# Patient Record
Sex: Female | Born: 1990 | Race: White | Hispanic: No | Marital: Married | Smoking: Never smoker
Health system: Southern US, Community
[De-identification: ages and names within clinical notes are randomized; demographics above are authoritative.]

## PROBLEM LIST (undated history)

## (undated) DIAGNOSIS — Z789 Other specified health status: Secondary | ICD-10-CM

## (undated) DIAGNOSIS — M5126 Other intervertebral disc displacement, lumbar region: Secondary | ICD-10-CM

## (undated) HISTORY — DX: Other specified health status: Z78.9

## (undated) HISTORY — PX: NO PAST SURGERIES: SHX2092

---

## 2016-06-22 LAB — OB RESULTS CONSOLE RUBELLA ANTIBODY, IGM: RUBELLA: IMMUNE

## 2016-06-22 LAB — OB RESULTS CONSOLE ANTIBODY SCREEN: Antibody Screen: NEGATIVE

## 2016-06-22 LAB — OB RESULTS CONSOLE ABO/RH: RH Type: POSITIVE

## 2016-06-22 LAB — OB RESULTS CONSOLE HIV ANTIBODY (ROUTINE TESTING): HIV: NONREACTIVE

## 2016-06-22 LAB — OB RESULTS CONSOLE HEPATITIS B SURFACE ANTIGEN: Hepatitis B Surface Ag: NEGATIVE

## 2016-06-22 LAB — OB RESULTS CONSOLE GC/CHLAMYDIA
CHLAMYDIA, DNA PROBE: NEGATIVE
GC PROBE AMP, GENITAL: NEGATIVE

## 2016-06-22 LAB — OB RESULTS CONSOLE RPR: RPR: NONREACTIVE

## 2016-11-10 ENCOUNTER — Encounter (HOSPITAL_COMMUNITY): Payer: Self-pay

## 2016-11-10 ENCOUNTER — Emergency Department (HOSPITAL_COMMUNITY)
Admission: EM | Admit: 2016-11-10 | Discharge: 2016-11-10 | Disposition: A | Discharge status: Home / Self Care | Payer: 59 | Attending: Emergency Medicine | Admitting: Emergency Medicine

## 2016-11-10 DIAGNOSIS — L259 Unspecified contact dermatitis, unspecified cause: Secondary | ICD-10-CM

## 2016-11-10 DIAGNOSIS — O26893 Other specified pregnancy related conditions, third trimester: Secondary | ICD-10-CM | POA: Diagnosis present

## 2016-11-10 DIAGNOSIS — L258 Unspecified contact dermatitis due to other agents: Secondary | ICD-10-CM | POA: Diagnosis not present

## 2016-11-10 DIAGNOSIS — Z3A29 29 weeks gestation of pregnancy: Secondary | ICD-10-CM | POA: Insufficient documentation

## 2016-11-10 DIAGNOSIS — R21 Rash and other nonspecific skin eruption: Secondary | ICD-10-CM | POA: Diagnosis not present

## 2016-11-10 MED ORDER — FAMOTIDINE 20 MG PO TABS: 20.0000 mg | ORAL_TABLET | Freq: Two times a day (BID) | ORAL | 0 refills | Status: DC

## 2016-11-10 MED ORDER — FAMOTIDINE 20 MG PO TABS
20.0000 mg | ORAL_TABLET | Freq: Once | ORAL | Status: AC
  Administered 2016-11-10: 20 mg via ORAL
  Filled 2016-11-10: qty 1

## 2016-11-10 NOTE — ED Provider Notes (Signed)
[facility]-EMERGENCY DEPT Provider Note   CSN: [id number] Arrival date & time: 11/10/16  0257  Time seen 05:27 AM   History   Chief Complaint Chief Complaint  Patient presents with  . Rash    HPI Glenda Robles is a 26 y.o. female.  HPI patient states October 17 she started having a itchy rash that started in her groin area and has spread into her back.  She was seen on October 19 by her OB/GYN and was started on Claritin and Benadryl as needed for itching, and hydrocortisone cream.  She also was given a steroid Dosepak and was told to start it today.  She states her itching has improved with the Claritin and Benadryl however now the rash is painful.  She denies anything new in her environment including soaps, detergents, foods, clothing, and she denies doing any type of yard work.  They do not have any pets.  She has never had this before.  Nobody else has rash at the house.  Patient is G1 P0 Ab0, [redacted] weeks pregnant without complications.  OB Dr Mindi SlickerBanga  History reviewed. No pertinent past medical history.  There are no active problems to display for this patient.   History reviewed. No pertinent surgical history.  OB History    Gravida Para Term Preterm AB Living   1             SAB TAB Ectopic Multiple Live Births                   Home Medications    Prior to Admission medications   Medication Sig Start Date End Date Taking? Authorizing Provider  famotidine (PEPCID) 20 MG tablet Take 1 tablet (20 mg total) by mouth 2 (two) times daily. 11/10/16   Devoria AlbeKnapp, Gottlieb Zuercher, MD    Family History History reviewed. No pertinent family history.  Social History Social History  Substance Use Topics  . Smoking status: Never Smoker  . Smokeless tobacco: Never Used  . Alcohol use Not on file  employed   Allergies   Patient has no known allergies.   Review of Systems Review of Systems  All other systems reviewed and are negative.    Physical  Exam Updated Vital Signs BP (!) 129/93 (BP Location: Left Arm)   Pulse (!) 107   Temp 98.2 F (36.8 C) (Oral)   Resp 15   Ht 5\' 3"  (1.6 m)   Wt 70.3 kg (155 lb)   SpO2 100%   BMI 27.46 kg/m   Physical Exam  Constitutional: She is oriented to person, place, and time. She appears well-developed and well-nourished.  HENT:  Head: Normocephalic and atraumatic.  Right Ear: External ear normal.  Left Ear: External ear normal.  Nose: Nose normal.  Mouth/Throat: Oropharynx is clear and moist.  Eyes: Conjunctivae and EOM are normal.  Neck: Normal range of motion. Neck supple.  Cardiovascular: Normal rate.   Pulmonary/Chest: Effort normal. No respiratory distress.  Musculoskeletal: Normal range of motion. She exhibits no deformity.  Neurological: She is alert and oriented to person, place, and time. No cranial nerve deficit.  Skin: Skin is warm and dry. Rash noted.  Patient has intensely reddened rash in her inguinal folds bilaterally, left worse than the right with small vesicular lesions along the edges consistent with a contact dermatitis.  She also has some spread up towards her breast and in her back.  But the most intense is in her inguinal areas.  Patient  is noted to have shaved recently but she states she shaved after the rash started.  Psychiatric: She has a normal mood and affect. Her behavior is normal. Thought content normal.  Nursing note and vitals reviewed.      ED Treatments / Results  Labs (all labs ordered are listed, but only abnormal results are displayed) Labs Reviewed - No data to display  EKG  EKG Interpretation None       Radiology No results found.  Procedures Procedures (including critical care time)  Medications Ordered in ED Medications  famotidine (PEPCID) tablet 20 mg (not administered)     Initial Impression / Assessment and Plan / ED Course  I have reviewed the triage vital signs and the nursing notes.  Pertinent labs & imaging  results that were available during my care of the patient were reviewed by me and considered in my medical decision making (see chart for details).     We reviewed her methylprednisolone dose pack, patient is going to take her first dose now in the ED, and Pepcid was added.  It appears the Claritin and Benadryl have helped with the itching because that is not a complaint now.  We discussed to come back if she gets lesions in her mouth has difficulty breathing or gets a high fever.  Final Clinical Impressions(s) / ED Diagnoses   Final diagnoses:  Contact dermatitis, unspecified contact dermatitis type, unspecified trigger    New Prescriptions New Prescriptions   FAMOTIDINE (PEPCID) 20 MG TABLET    Take 1 tablet (20 mg total) by mouth 2 (two) times daily.    Plan discharge  Devoria Albe, MD, Concha Pyo, MD 11/10/16 [phone]

## 2016-11-10 NOTE — ED Notes (Signed)
Bed: WA04 Expected date:  Expected time:  Means of arrival:  Comments: 

## 2016-11-10 NOTE — ED Notes (Signed)
Pt went to OB-GYN on Wednesday for a rash on the LLQ abdomen. She was given cortisone cream, claritin, and prednisone. On Thursday night/Friday morning the rash spread around the abdomen/back/chest and became painful.

## 2016-11-10 NOTE — Discharge Instructions (Signed)
Use ice packs for comfort. You can try baking soda baths for comfort. Start your methylprednisolone pills this morning. Continue the zyrtec and benadryl as needed for itching.   Recheck if you get a fever, lesions in your mouth, you have trouble breathing or feel worse.

## 2016-11-10 NOTE — ED Triage Notes (Signed)
Pt reports abdominal rash that started Wednesday.

## 2016-11-10 NOTE — ED Notes (Signed)
Bed: WA05 Expected date:  Expected time:  Means of arrival:  Comments: 

## 2016-12-26 LAB — OB RESULTS CONSOLE GBS: GBS: NEGATIVE

## 2017-01-18 ENCOUNTER — Encounter (HOSPITAL_COMMUNITY): Payer: Self-pay | Admitting: *Deleted

## 2017-01-18 ENCOUNTER — Telehealth (HOSPITAL_COMMUNITY): Payer: Self-pay | Admitting: *Deleted

## 2017-01-18 ENCOUNTER — Ambulatory Visit (INDEPENDENT_AMBULATORY_CARE_PROVIDER_SITE_OTHER): Payer: Self-pay | Admitting: Pediatrics

## 2017-01-18 DIAGNOSIS — Z7681 Expectant parent(s) prebirth pediatrician visit: Secondary | ICD-10-CM

## 2017-01-18 NOTE — Telephone Encounter (Signed)
Preadmission screen  

## 2017-01-22 NOTE — H&P (Signed)
Glenda Robles is a 27 y.o. femaleG1P0 at 40 0/7 weeks (EDD 01/23/17 by LMP c/w 8 week US)   presenting for IOL at term.  Preanatal care uneventful   OB History    Gravida Para Term Preterm AB Living   1             SAB TAB Ectopic Multiple Live Births                 Past Medical History:  Diagnosis Date  . Medical history non-contributory    Past Surgical History:  Procedure Laterality Date  . NO PAST SURGERIES     Family History: family history includes Diabetes in her mother; Hypertension in her mother. Social History:  reports that  has never smoked. she has never used smokeless tobacco. She reports that she does not drink alcohol or use drugs.     Maternal Diabetes: No Genetic Screening: Declined Maternal Ultrasounds/Referrals: Normal Fetal Ultrasounds or other Referrals:  None Maternal Substance Abuse:  No Significant Maternal Medications:  None Significant Maternal Lab Results:  None Other Comments:  None  Review of Systems  Gastrointestinal: Negative for abdominal pain and heartburn.   Maternal Medical History:  Contractions: Frequency: irregular.   Perceived severity is mild.    Fetal activity: Perceived fetal activity is normal.    Prenatal Complications - Diabetes: none.      Last menstrual period 04/18/2016. Maternal Exam:  Uterine Assessment: Contraction strength is mild.  Contraction frequency is irregular.   Abdomen: Patient reports no abdominal tenderness. Fetal presentation: vertex  Introitus: Normal vulva. Normal vagina.  Pelvis: adequate for delivery.      Physical Exam  Constitutional: She appears well-developed and well-nourished.  Cardiovascular: Normal rate and regular rhythm.  Respiratory: Effort normal.  GI: Soft.  Genitourinary: Vagina normal.  Neurological: She is alert.  Psychiatric: She has a normal mood and affect.    Prenatal labs: ABO, Rh: O/Positive/-- (06/01 0000) Antibody: Negative (06/01 0000) Rubella: Immune (06/01  0000) RPR: Nonreactive (06/01 0000)  HBsAg: Negative (06/01 0000)  HIV: Non-reactive (06/01 0000)  GBS: Negative (12/05 0000)  One hour GCT 97 Hemoglobin AA  Assessment/Plan: Pt for IOL at term with favorable cervix.    Plan AROM and pitocin.   Oliver PilaKathy W Gregg Winchell 01/22/2017, 8:50 PM

## 2017-01-22 NOTE — L&D Delivery Note (Signed)
Delivery Note Patient reached complete dilation and pushed great.  At 5:38 PM a viable female was delivered via Vaginal, Spontaneous (Presentation: LOA  ).  APGAR: 8, 9; weight pending  .   Placenta status: delivered spontaneously.  One fragment removed on fundal exploration and no other placenta felt.    Cord:  with the following complications: nuchal x 1--reduced.   Anesthesia:  epidural Episiotomy: None Lacerations: 1st degree;Perineal Suture Repair: 3.0 vicryl rapide Est. Blood Loss (mL): 225mL  Mom to postpartum.  Baby to Couplet care / Skin to Skin. D/w pt circumcision and they desire in the hospital.  Oliver PilaKathy W Raisha Brabender 01/23/2017, 6:02 PM

## 2017-01-23 ENCOUNTER — Inpatient Hospital Stay (HOSPITAL_COMMUNITY): Payer: Managed Care, Other (non HMO) | Admitting: Anesthesiology

## 2017-01-23 ENCOUNTER — Inpatient Hospital Stay (HOSPITAL_COMMUNITY)
Admission: RE | Admit: 2017-01-23 | Discharge: 2017-01-25 | DRG: 807 | Disposition: A | Discharge status: Home / Self Care | Payer: Managed Care, Other (non HMO) | Source: Ambulatory Visit | Attending: Obstetrics and Gynecology | Admitting: Obstetrics and Gynecology

## 2017-01-23 ENCOUNTER — Inpatient Hospital Stay (HOSPITAL_COMMUNITY)
Admission: AD | Admit: 2017-01-23 | Payer: Managed Care, Other (non HMO) | Source: Ambulatory Visit | Admitting: Obstetrics and Gynecology

## 2017-01-23 ENCOUNTER — Encounter (HOSPITAL_COMMUNITY): Payer: Self-pay

## 2017-01-23 DIAGNOSIS — Z3A40 40 weeks gestation of pregnancy: Secondary | ICD-10-CM

## 2017-01-23 DIAGNOSIS — Z3483 Encounter for supervision of other normal pregnancy, third trimester: Secondary | ICD-10-CM | POA: Diagnosis present

## 2017-01-23 LAB — CBC
HCT: 38 % (ref 36.0–46.0)
Hemoglobin: 12.5 g/dL (ref 12.0–15.0)
MCH: 30.4 pg (ref 26.0–34.0)
MCHC: 32.9 g/dL (ref 30.0–36.0)
MCV: 92.5 fL (ref 78.0–100.0)
PLATELETS: 174 10*3/uL (ref 150–400)
RBC: 4.11 MIL/uL (ref 3.87–5.11)
RDW: 14.7 % (ref 11.5–15.5)
WBC: 9.9 10*3/uL (ref 4.0–10.5)

## 2017-01-23 LAB — ABO/RH: ABO/RH(D): O POS

## 2017-01-23 LAB — TYPE AND SCREEN
ABO/RH(D): O POS
Antibody Screen: NEGATIVE

## 2017-01-23 LAB — RPR: RPR Ser Ql: NONREACTIVE

## 2017-01-23 MED ORDER — OXYTOCIN 40 UNITS IN LACTATED RINGERS INFUSION - SIMPLE MED: 2.5000 [IU]/h | INTRAVENOUS | Status: DC

## 2017-01-23 MED ORDER — BENZOCAINE-MENTHOL 20-0.5 % EX AERO
1.0000 "application " | INHALATION_SPRAY | CUTANEOUS | Status: DC | PRN
  Administered 2017-01-24: 1 via TOPICAL
  Filled 2017-01-23: qty 56

## 2017-01-23 MED ORDER — OXYCODONE-ACETAMINOPHEN 5-325 MG PO TABS: 2.0000 | ORAL_TABLET | ORAL | Status: DC | PRN

## 2017-01-23 MED ORDER — ONDANSETRON HCL 4 MG/2ML IJ SOLN: 4.0000 mg | INTRAMUSCULAR | Status: DC | PRN

## 2017-01-23 MED ORDER — PHENYLEPHRINE 40 MCG/ML (10ML) SYRINGE FOR IV PUSH (FOR BLOOD PRESSURE SUPPORT)
80.0000 ug | PREFILLED_SYRINGE | INTRAVENOUS | Status: DC | PRN
  Filled 2017-01-23: qty 5

## 2017-01-23 MED ORDER — ONDANSETRON HCL 4 MG/2ML IJ SOLN: 4.0000 mg | Freq: Four times a day (QID) | INTRAMUSCULAR | Status: DC | PRN

## 2017-01-23 MED ORDER — OXYTOCIN BOLUS FROM INFUSION
500.0000 mL | Freq: Once | INTRAVENOUS | Status: AC
  Administered 2017-01-23: 500 mL via INTRAVENOUS

## 2017-01-23 MED ORDER — IBUPROFEN 600 MG PO TABS
600.0000 mg | ORAL_TABLET | Freq: Four times a day (QID) | ORAL | Status: DC
  Administered 2017-01-23 – 2017-01-25 (×6): 600 mg via ORAL
  Filled 2017-01-23 (×6): qty 1

## 2017-01-23 MED ORDER — DIBUCAINE 1 % RE OINT: 1.0000 "application " | TOPICAL_OINTMENT | RECTAL | Status: DC | PRN

## 2017-01-23 MED ORDER — ONDANSETRON HCL 4 MG PO TABS: 4.0000 mg | ORAL_TABLET | ORAL | Status: DC | PRN

## 2017-01-23 MED ORDER — SIMETHICONE 80 MG PO CHEW: 80.0000 mg | CHEWABLE_TABLET | ORAL | Status: DC | PRN

## 2017-01-23 MED ORDER — BUTORPHANOL TARTRATE 1 MG/ML IJ SOLN: 1.0000 mg | INTRAMUSCULAR | Status: DC | PRN

## 2017-01-23 MED ORDER — DIPHENHYDRAMINE HCL 50 MG/ML IJ SOLN: 12.5000 mg | INTRAMUSCULAR | Status: DC | PRN

## 2017-01-23 MED ORDER — COCONUT OIL OIL: 1.0000 "application " | TOPICAL_OIL | Status: DC | PRN

## 2017-01-23 MED ORDER — ZOLPIDEM TARTRATE 5 MG PO TABS: 5.0000 mg | ORAL_TABLET | Freq: Every evening | ORAL | Status: DC | PRN

## 2017-01-23 MED ORDER — LACTATED RINGERS IV SOLN
INTRAVENOUS | Status: DC
  Administered 2017-01-23: 1000 mL via INTRAVENOUS

## 2017-01-23 MED ORDER — EPHEDRINE 5 MG/ML INJ
10.0000 mg | INTRAVENOUS | Status: DC | PRN
  Filled 2017-01-23: qty 2

## 2017-01-23 MED ORDER — TETANUS-DIPHTH-ACELL PERTUSSIS 5-2.5-18.5 LF-MCG/0.5 IM SUSP: 0.5000 mL | Freq: Once | INTRAMUSCULAR | Status: DC

## 2017-01-23 MED ORDER — FENTANYL 2.5 MCG/ML BUPIVACAINE 1/10 % EPIDURAL INFUSION (WH - ANES)
14.0000 mL/h | INTRAMUSCULAR | Status: DC | PRN
  Administered 2017-01-23: 14 mL/h via EPIDURAL
  Filled 2017-01-23: qty 100

## 2017-01-23 MED ORDER — PHENYLEPHRINE 40 MCG/ML (10ML) SYRINGE FOR IV PUSH (FOR BLOOD PRESSURE SUPPORT)
80.0000 ug | PREFILLED_SYRINGE | INTRAVENOUS | Status: DC | PRN
  Filled 2017-01-23: qty 5
  Filled 2017-01-23: qty 10

## 2017-01-23 MED ORDER — PRENATAL MULTIVITAMIN CH
1.0000 | ORAL_TABLET | Freq: Every day | ORAL | Status: DC
  Administered 2017-01-24: 1 via ORAL
  Filled 2017-01-23: qty 1

## 2017-01-23 MED ORDER — LACTATED RINGERS IV SOLN: 500.0000 mL | INTRAVENOUS | Status: DC | PRN

## 2017-01-23 MED ORDER — ACETAMINOPHEN 325 MG PO TABS: 650.0000 mg | ORAL_TABLET | ORAL | Status: DC | PRN

## 2017-01-23 MED ORDER — OXYCODONE-ACETAMINOPHEN 5-325 MG PO TABS: 1.0000 | ORAL_TABLET | ORAL | Status: DC | PRN

## 2017-01-23 MED ORDER — WITCH HAZEL-GLYCERIN EX PADS: 1.0000 "application " | MEDICATED_PAD | CUTANEOUS | Status: DC | PRN

## 2017-01-23 MED ORDER — OXYTOCIN 40 UNITS IN LACTATED RINGERS INFUSION - SIMPLE MED
1.0000 m[IU]/min | INTRAVENOUS | Status: DC
  Administered 2017-01-23: 2 m[IU]/min via INTRAVENOUS
  Filled 2017-01-23: qty 1000

## 2017-01-23 MED ORDER — LACTATED RINGERS IV SOLN
500.0000 mL | Freq: Once | INTRAVENOUS | Status: AC
  Administered 2017-01-23: 500 mL via INTRAVENOUS

## 2017-01-23 MED ORDER — LIDOCAINE HCL (PF) 1 % IJ SOLN
30.0000 mL | INTRAMUSCULAR | Status: DC | PRN
  Filled 2017-01-23: qty 30

## 2017-01-23 MED ORDER — TERBUTALINE SULFATE 1 MG/ML IJ SOLN
0.2500 mg | Freq: Once | INTRAMUSCULAR | Status: DC | PRN
  Filled 2017-01-23: qty 1

## 2017-01-23 MED ORDER — LIDOCAINE HCL (PF) 1 % IJ SOLN
INTRAMUSCULAR | Status: DC | PRN
  Administered 2017-01-23: 13 mL via EPIDURAL

## 2017-01-23 MED ORDER — DIPHENHYDRAMINE HCL 25 MG PO CAPS: 25.0000 mg | ORAL_CAPSULE | Freq: Four times a day (QID) | ORAL | Status: DC | PRN

## 2017-01-23 MED ORDER — SENNOSIDES-DOCUSATE SODIUM 8.6-50 MG PO TABS
2.0000 | ORAL_TABLET | ORAL | Status: DC
  Administered 2017-01-24 (×2): 2 via ORAL
  Filled 2017-01-23 (×2): qty 2

## 2017-01-23 MED ORDER — SOD CITRATE-CITRIC ACID 500-334 MG/5ML PO SOLN: 30.0000 mL | ORAL | Status: DC | PRN

## 2017-01-23 NOTE — Anesthesia Pain Management Evaluation Note (Signed)
  CRNA Pain Management Visit Note  Patient: Glenda Robles, 27 y.o., female  "Hello I am a [facility] of the [facility]. We have an anesthesia team available at all times to provide care throughout the hospital, including epidural management and anesthesia for C-section. I don't know your plan for the delivery whether it a natural birth, water birth, IV sedation, nitrous supplementation, doula or epidural, but we want to meet your pain goals."   1.Was your pain managed to your expectations on prior hospitalizations?   No prior hospitalizations  2.What is your expectation for pain management during this hospitalization?     Epidural  3.How can we help you reach that goal? epidural  Record the patient's initial score and the patient's pain goal.   Pain: 4  Pain Goal: 4 [facility] wants you to be able to say your pain was always managed very well.  Glenda Robles 01/23/2017

## 2017-01-23 NOTE — Anesthesia Procedure Notes (Addendum)
Epidural Patient location during procedure: OB Start time: 01/23/2017 1:32 PM End time: 01/23/2017 1:45 PM  Staffing Anesthesiologist: Lowella CurbMiller, Julieanne Hadsall Ray, MD Performed: anesthesiologist   Preanesthetic Checklist Completed: patient identified, site marked, surgical consent, pre-op evaluation, timeout performed, IV checked, risks and benefits discussed and monitors and equipment checked  Epidural Patient position: sitting Prep: ChloraPrep Patient monitoring: heart rate, cardiac monitor, continuous pulse ox and blood pressure Approach: midline Location: L2-L3 Injection technique: LOR air  Needle:  Needle type: Tuohy  Needle gauge: 17 G Needle length: 9 cm Needle insertion depth: 6 cm Catheter type: closed end flexible Catheter size: 20 Guage Catheter at skin depth: 10 cm Test dose: negative  Assessment Events: blood not aspirated, injection not painful, no injection resistance, negative IV test and no paresthesia  Additional Notes Reason for block:procedure for pain

## 2017-01-23 NOTE — Progress Notes (Signed)
Patient ID: Glenda Robles, female   DOB: [birth year], 27 y.o.   MRN: [id number] Pt just getting uncomfortable with contractions but still coping ok  afeb VSS FHR category 1  Cervix 70/3/-1  No forebag  Continue to increase pitocin until adequate labor Planning epidural

## 2017-01-23 NOTE — Progress Notes (Signed)
Patient ID: Glenda Robles, female   DOB: 05/28/1990, 27 y.o.   MRN: [id number] Pt feeling mild cramping  FHR category 1 afeb VSS Cervix 2/70/-2 AROM clear  On pitocin, will follow progress.

## 2017-01-23 NOTE — Anesthesia Preprocedure Evaluation (Signed)

## 2017-01-24 LAB — CBC
HCT: 31.6 % — ABNORMAL LOW (ref 36.0–46.0)
Hemoglobin: 10.7 g/dL — ABNORMAL LOW (ref 12.0–15.0)
MCH: 31.2 pg (ref 26.0–34.0)
MCHC: 33.9 g/dL (ref 30.0–36.0)
MCV: 92.1 fL (ref 78.0–100.0)
PLATELETS: 150 10*3/uL (ref 150–400)
RBC: 3.43 MIL/uL — AB (ref 3.87–5.11)
RDW: 14.7 % (ref 11.5–15.5)
WBC: 11.2 10*3/uL — AB (ref 4.0–10.5)

## 2017-01-24 NOTE — Lactation Note (Signed)
This note was copied from a baby's chart. Lactation Consultation Note  Patient Name: Glenda Robles VHQIO'NToday's Date: 01/24/2017 Reason for consult: Initial assessment;1st time breastfeeding;Primapara;Term   Initial consult with mom of 5817 hour old infant. Infant with 4 BF for 10-35 minutes, 4 BF attempts, 1 void and 5 stools since birth. LATCH scores 5-7. Infant weight 6 lb 8.1 oz with 1 % weight loss since birth.   Mom with large compressible breasts with small short shaft nipples. Mom reports + breast growth with pregnancy. Showed mom how to hand express and she was able to hand express large gtts colostrum.   Mom reports some nipple tenderness with latch that improves with feeding.Enc mom to apply EBM to nipples post BF.  Mom reports she is using cross cradle and football holds with latch. She reports she is having some difficulty with latching to the left breast, enc mom to call out for feedings for assistance.   Enc mom to feed infant STS 8-12 x in 24 hours at first feeding cues. Enc mom to use pillow and head support with feeding, mom says she is doing so.   BF basics, cluster feeding, pillow and head support, hand expression and milk coming to volume discussed. Discussed importance of stimulating infant as needed when sleepy at the breast and massage/intermittent compression of the breast with feedings to maximize milk transfer.   BF Resources handout and LC Brochure given, mom informed of IP/OP Services, BF Support Groups and LC Phone #. Enc mom to call out for feeding assistance as needed.   Mom is planning to call her Insurance company to obtain DEBP.   Mom without further questions/concerns at this time.        Maternal Data Formula Feeding for Exclusion: No Has patient been taught Hand Expression?: Yes Does the patient have breastfeeding experience prior to this delivery?: No  Feeding Feeding Type: Breast Fed Length of feed: 15 min  LATCH Score                    Interventions Interventions: Breast feeding basics reviewed;Support pillows;Position options;Skin to skin;Expressed milk;Breast compression;Breast massage  Lactation Tools Discussed/Used WIC Program: No   Consult Status Consult Status: Follow-up Date: 01/25/17 Follow-up type: In-patient    Silas FloodSharon S Jiro Kiester 01/24/2017, 11:11 AM

## 2017-01-24 NOTE — Anesthesia Postprocedure Evaluation (Signed)
Anesthesia Post Note  Patient: Glenda Robles  Procedure(s) Performed: AN AD HOC LABOR EPIDURAL     Patient location during evaluation: Mother Baby Anesthesia Type: Epidural Level of consciousness: awake and alert and oriented Pain management: satisfactory to patient Vital Signs Assessment: post-procedure vital signs reviewed and stable Respiratory status: spontaneous breathing and nonlabored ventilation Cardiovascular status: stable Postop Assessment: no headache, no backache, no signs of nausea or vomiting, adequate PO intake and patient able to bend at knees (patient up walking) Anesthetic complications: no    Last Vitals:  Vitals:   01/24/17 0545 01/24/17 0743  BP: 104/60 127/76  Pulse: 95 96  Resp: 18 16  Temp: 36.9 C 37 C  SpO2:      Last Pain:  Vitals:   01/24/17 0743  TempSrc: Oral  PainSc: 0-No pain   Pain Goal: Patients Stated Pain Goal: 2 (01/23/17 1255)               Madison HickmanGREGORY,Americo Vallery

## 2017-01-24 NOTE — Progress Notes (Signed)
Post Partum Day 1 Subjective: no complaints, up ad lib, voiding, tolerating PO and nl lochia, pain controlled  Objective: Blood pressure 104/60, pulse 95, temperature 98.4 F (36.9 C), temperature source Axillary, resp. rate 18, height 5\' 3"  (1.6 m), weight 76.7 kg (169 lb), last menstrual period 04/18/2016, SpO2 100 %, unknown if currently breastfeeding.  Physical Exam:  General: alert and no distress Lochia: appropriate Uterine Fundus: firm  Recent Labs    01/23/17 0815 01/24/17 0546  HGB 12.5 10.7*  HCT 38.0 31.6*    Assessment/Plan: Plan for discharge tomorrow.  Circumcision today.  Routine PP care.     LOS: 1 day   Glenda Robles 01/24/2017, 7:33 AM

## 2017-01-25 MED ORDER — IBUPROFEN 600 MG PO TABS: 600.0000 mg | ORAL_TABLET | Freq: Four times a day (QID) | ORAL | 0 refills | Status: DC

## 2017-01-25 NOTE — Progress Notes (Signed)
Prenatal counseling for impending newborn done--1st child, currently 39wks, no complications, prenatal started at 1-2 months Z76.81

## 2017-01-25 NOTE — Discharge Summary (Signed)
    OB Discharge Summary     Patient Name: Glenda Robles DOB: 12-05-1990 MRN: [id number]  Date of admission: 01/23/2017 Delivering MD: Huel CoteICHARDSON, KATHY   Date of discharge: 01/25/2017  Admitting diagnosis: INDUCTION Intrauterine pregnancy: 8095w0d     Secondary diagnosis:  Active Problems:   Indication for care in labor and delivery, antepartum   NSVD (normal spontaneous vaginal delivery)      Discharge diagnosis: Term Pregnancy Delivered                                  Hospital course:  Induction of Labor With Vaginal Delivery   27 y.o. yo G1P1001 at 8295w0d was admitted to the hospital 01/23/2017 for induction of labor.  Indication for induction: Favorable cervix at term.  Patient had an uncomplicated labor course as follows: Membrane Rupture Time/Date: 9:01 AM ,01/23/2017   Intrapartum Procedures: Episiotomy: None [1]                                         Lacerations:  1st degree [2];Perineal [11]  Patient had delivery of a Viable infant.  Information for the patient's newborn:  Hattie PerchYassa, Boy Julietta [[id number]][[id number]]  Delivery Method: Vag-Spont   01/23/2017  Details of delivery can be found in separate delivery note.  Patient had a routine postpartum course. Patient is discharged home 01/25/17.  Physical exam  Vitals:   01/24/17 0545 01/24/17 0743 01/24/17 1718 01/25/17 0550  BP: 104/60 127/76 127/83 117/72  Pulse: 95 96 (!) 103 93  Resp: 18 16 20 16   Temp: 98.4 F (36.9 C) 98.6 F (37 C) 98.1 F (36.7 C) 98.4 F (36.9 C)  TempSrc: Axillary Oral Oral Oral  SpO2:   100%   Weight:      Height:       General: alert Lochia: appropriate Uterine Fundus: firm  Labs: Lab Results  Component Value Date   WBC 11.2 (H) 01/24/2017   HGB 10.7 (L) 01/24/2017   HCT 31.6 (L) 01/24/2017   MCV 92.1 01/24/2017   PLT 150 01/24/2017   No flowsheet data found.  Discharge instruction: per After Visit Summary and "Baby and Me Booklet".  After visit meds:  Allergies as of 01/25/2017   No Known  Allergies     Medication List    STOP taking these medications   famotidine 20 MG tablet Commonly known as:  PEPCID     TAKE these medications   ibuprofen 600 MG tablet Commonly known as:  ADVIL,MOTRIN Take 1 tablet (600 mg total) by mouth every 6 (six) hours.   prenatal multivitamin Tabs tablet Take 1 tablet by mouth daily at 12 noon.       Diet: routine diet  Activity: Advance as tolerated. Pelvic rest for 6 weeks.   Outpatient follow up:6 weeks   Newborn Data: Live born female  Birth Weight: 6 lb 9.5 oz (2990 g) APGAR: 8, 9  Newborn Delivery   Birth date/time:  01/23/2017 17:38:00 Delivery type:  Vaginal, Spontaneous     Baby Feeding: Breast Disposition:home with mother   01/25/2017 Glenda Nieceodd D Likisha Alles, MD

## 2017-01-25 NOTE — Discharge Instructions (Signed)
As per discharge pamphlet °

## 2017-01-25 NOTE — Progress Notes (Signed)
PPD #2 No problems Afeb, VSS D/c home 

## 2017-09-26 ENCOUNTER — Ambulatory Visit: Payer: Self-pay | Admitting: Orthopedic Surgery

## 2017-09-27 NOTE — Pre-Procedure Instructions (Signed)
Glenda Robles  09/27/2017      [id number] [place], [place] - [address] AT [facility] OF [address] [place] [address] Phone: [phone] Fax: [phone]    Your procedure is scheduled on Thursday September 12th.  Report to [facility] Admitting at 0530 A.M.  Call this number if you have problems the morning of surgery:  [phone]   Remember:  Do not eat or drink after midnight.      Take these medicines the morning of surgery with A SIP OF WATER   Gabapentin (if needed)    Do not wear jewelry, make-up or nail polish.  Do not wear lotions, powders, or perfumes, or deodorant.  Do not shave 48 hours prior to surgery.  Men may shave face and neck.  Do not bring valuables to the hospital.  [facility] is not responsible for any belongings or valuables.  Contacts, dentures or bridgework may not be worn into surgery.  Leave your suitcase in the car.  After surgery it may be brought to your room.  For patients admitted to the hospital, discharge time will be determined by your treatment team.  Patients discharged the day of surgery will not be allowed to drive home.    [facility]- Preparing For Surgery  Before surgery, you can play an important role. Because skin is not sterile, your skin needs to be as free of germs as possible. You can reduce the number of germs on your skin by washing with CHG (chlorahexidine gluconate) Soap before surgery.  CHG is an antiseptic cleaner which kills germs and bonds with the skin to continue killing germs even after washing.    Oral Hygiene is also important to reduce your risk of infection.  Remember - BRUSH YOUR TEETH THE MORNING OF SURGERY WITH YOUR REGULAR TOOTHPASTE  Please do not use if you have an allergy to CHG or antibacterial soaps. If your skin becomes reddened/irritated stop using the CHG.  Do not shave (including legs and underarms) for at least 48 hours prior to first  CHG shower. It is OK to shave your face.  Please follow these instructions carefully.   1. Shower the NIGHT BEFORE SURGERY and the MORNING OF SURGERY with CHG.   2. If you chose to wash your hair, wash your hair first as usual with your normal shampoo.  3. After you shampoo, rinse your hair and body thoroughly to remove the shampoo.  4. Use CHG as you would any other liquid soap. You can apply CHG directly to the skin and wash gently with a scrungie or a clean washcloth.   5. Apply the CHG Soap to your body ONLY FROM THE NECK DOWN.  Do not use on open wounds or open sores. Avoid contact with your eyes, ears, mouth and genitals (private parts). Wash Face and genitals (private parts)  with your normal soap.  6. Wash thoroughly, paying special attention to the area where your surgery will be performed.  7. Thoroughly rinse your body with warm water from the neck down.  8. DO NOT shower/wash with your normal soap after using and rinsing off the CHG Soap.  9. Pat yourself dry with a CLEAN TOWEL.  10. Wear CLEAN PAJAMAS to bed the night before surgery, wear comfortable clothes the morning of surgery  11. Place CLEAN SHEETS on your bed the night of your first shower and DO NOT SLEEP WITH PETS.  Day of Surgery:  Do not apply any deodorants/lotions.  Please wear clean clothes to the hospital/surgery center.   Remember to brush your teeth WITH YOUR REGULAR TOOTHPASTE.    Please read over the following fact sheets that you were given. Coughing and Deep Breathing, MRSA Information and Surgical Site Infection Prevention

## 2017-09-30 ENCOUNTER — Encounter (HOSPITAL_COMMUNITY)
Admission: RE | Admit: 2017-09-30 | Discharge: 2017-09-30 | Disposition: A | Discharge status: Home / Self Care | Payer: Managed Care, Other (non HMO) | Source: Ambulatory Visit | Attending: Specialist | Admitting: Specialist

## 2017-09-30 ENCOUNTER — Ambulatory Visit (HOSPITAL_COMMUNITY)
Admission: RE | Admit: 2017-09-30 | Discharge: 2017-09-30 | Disposition: A | Discharge status: Home / Self Care | Payer: Managed Care, Other (non HMO) | Source: Ambulatory Visit | Attending: Orthopedic Surgery | Admitting: Orthopedic Surgery

## 2017-09-30 ENCOUNTER — Encounter (HOSPITAL_COMMUNITY): Payer: Self-pay

## 2017-09-30 ENCOUNTER — Other Ambulatory Visit: Payer: Self-pay

## 2017-09-30 DIAGNOSIS — Z01818 Encounter for other preprocedural examination: Secondary | ICD-10-CM | POA: Diagnosis not present

## 2017-09-30 DIAGNOSIS — M5126 Other intervertebral disc displacement, lumbar region: Secondary | ICD-10-CM | POA: Insufficient documentation

## 2017-09-30 HISTORY — DX: Other intervertebral disc displacement, lumbar region: M51.26

## 2017-09-30 LAB — CBC
HCT: 45.4 % (ref 36.0–46.0)
HEMOGLOBIN: 14.6 g/dL (ref 12.0–15.0)
MCH: 30.6 pg (ref 26.0–34.0)
MCHC: 32.2 g/dL (ref 30.0–36.0)
MCV: 95.2 fL (ref 78.0–100.0)
Platelets: ADEQUATE 10*3/uL (ref 150–400)
RBC: 4.77 MIL/uL (ref 3.87–5.11)
RDW: 12.2 % (ref 11.5–15.5)
WBC: 6.1 10*3/uL (ref 4.0–10.5)

## 2017-09-30 LAB — BASIC METABOLIC PANEL
ANION GAP: 11 (ref 5–15)
BUN: 9 mg/dL (ref 6–20)
CHLORIDE: 105 mmol/L (ref 98–111)
CO2: 22 mmol/L (ref 22–32)
Calcium: 9.4 mg/dL (ref 8.9–10.3)
Creatinine, Ser: 0.62 mg/dL (ref 0.44–1.00)
GFR calc non Af Amer: >60 mL/min (ref >60)
Glucose, Bld: 91 mg/dL (ref 70–99)
POTASSIUM: 4.7 mmol/L (ref 3.5–5.1)
Sodium: 138 mmol/L (ref 135–145)

## 2017-09-30 LAB — SURGICAL PCR SCREEN
MRSA, PCR: NEGATIVE
Staphylococcus aureus: NEGATIVE

## 2017-09-30 NOTE — Progress Notes (Signed)
Pt denies SOB, chest pain, and being under the care of a cardiologist. Pt denies having an echo, stress test and cardiac cath. Pt denies having an EKG and chest x ray within the last year. Pt denies recent labs.

## 2017-09-30 NOTE — Pre-Procedure Instructions (Addendum)
Glenda Robles  09/30/2017      [id number] [place], [place] - [address] AT [facility] OF [address] [place] [address] Phone: [phone] Fax: [phone]    Your procedure is scheduled on Thursday, October 03, 2017  Report to [facility] Admitting at 5:30 A.M.  Call this number if you have problems the morning of surgery:  [phone]   Remember:  Do not eat or drink after midnight.      Take these medicines the morning of surgery with A SIP OF WATER   Gabapentin (if needed) Stop taking Aspirin, vitamins, fish oil, Biotin and herbal medications. Do not take any NSAIDs ie: Ibuprofen, Advil, Naproxen (Aleve), Motrin, BC and Goody Powder; stop now.   Do not wear jewelry, make-up or nail polish.  Do not wear lotions, powders, or perfumes, or deodorant.  Do not shave 48 hours prior to surgery.    Do not bring valuables to the hospital.  [facility] is not responsible for any belongings or valuables.  Contacts, dentures or bridgework may not be worn into surgery.  Leave your suitcase in the car.  After surgery it may be brought to your room.  For patients admitted to the hospital, discharge time will be determined by your treatment team.  Patients discharged the day of surgery will not be allowed to drive home.    [place]- Preparing For Surgery  Before surgery, you can play an important role. Because skin is not sterile, your skin needs to be as free of germs as possible. You can reduce the number of germs on your skin by washing with CHG (chlorahexidine gluconate) Soap before surgery.  CHG is an antiseptic cleaner which kills germs and bonds with the skin to continue killing germs even after washing.    Oral Hygiene is also important to reduce your risk of infection.  Remember - BRUSH YOUR TEETH THE MORNING OF SURGERY WITH YOUR REGULAR TOOTHPASTE  Please do not use if you have an allergy to CHG or antibacterial  soaps. If your skin becomes reddened/irritated stop using the CHG.  Do not shave (including legs and underarms) for at least 48 hours prior to first CHG shower. It is OK to shave your face.  Please follow these instructions carefully.   1. Shower the NIGHT BEFORE SURGERY and the MORNING OF SURGERY with CHG.   2. If you chose to wash your hair, wash your hair first as usual with your normal shampoo.  3. After you shampoo, rinse your hair and body thoroughly to remove the shampoo.  4. Use CHG as you would any other liquid soap. You can apply CHG directly to the skin and wash gently with a scrungie or a clean washcloth.   5. Apply the CHG Soap to your body ONLY FROM THE NECK DOWN.  Do not use on open wounds or open sores. Avoid contact with your eyes, ears, mouth and genitals (private parts). Wash Face and genitals (private parts)  with your normal soap.  6. Wash thoroughly, paying special attention to the area where your surgery will be performed.  7. Thoroughly rinse your body with warm water from the neck down.  8. DO NOT shower/wash with your normal soap after using and rinsing off the CHG Soap.  9. Pat yourself dry with a CLEAN TOWEL.  10. Wear CLEAN PAJAMAS to bed the night before surgery, wear comfortable clothes the morning  of surgery  11. Place CLEAN SHEETS on your bed the night of your first shower and DO NOT SLEEP WITH PETS.    Day of Surgery:  Do not apply any deodorants/lotions.  Please wear clean clothes to the hospital/surgery center.   Remember to brush your teeth WITH YOUR REGULAR TOOTHPASTE.    Please read over the following fact sheets that you were given. Coughing and Deep Breathing, MRSA Information and Surgical Site Infection Prevention

## 2017-10-01 ENCOUNTER — Ambulatory Visit: Payer: Self-pay | Admitting: Orthopedic Surgery

## 2017-10-01 NOTE — H&P (Signed)
Glenda Robles is an 27 y.o. female.   Chief Complaint: back and leg pain, left worse than right, with weakness and numbness HPI: Reason for Visit: low back Context: The patient is 15 weeks out from when symptoms began Location (Lower Extremity): lower back pain ; leg pain on the left, , Severity: pain level 7-8/10 Medications: The patient rarely takes gabapentin Notes: The patient is 8 days out from [facility] L5-S1. She reports the injection did not help. Left greater than right pain. Leg pain worsen back pain  Past Medical History:  Diagnosis Date  . HNP (herniated nucleus pulposus), lumbar    L5-S1  . Medical history non-contributory     Past Surgical History:  Procedure Laterality Date  . NO PAST SURGERIES      Family History  Problem Relation Age of Onset  . Diabetes Mother   . Hypertension Mother    Social History:  reports that she has never smoked. She has never used smokeless tobacco. She reports that she does not drink alcohol or use drugs.  Allergies:  Allergies  Allergen Reactions  . Pork-Derived Products     Pt is Muslim    Results for orders placed or performed during the hospital encounter of 09/30/17 (from the past 48 hour(s))  Surgical pcr screen     Status: None   Collection Time: 09/30/17 10:03 AM  Result Value Ref Range   MRSA, PCR NEGATIVE NEGATIVE   Staphylococcus aureus NEGATIVE NEGATIVE    Comment: (NOTE) The Xpert SA Assay (FDA approved for NASAL specimens in patients 22 years of age and older), is one component of a comprehensive surveillance program. It is not intended to diagnose infection nor to guide or monitor treatment. Performed at [facility], [address] [place], [place] [address]   Basic metabolic panel     Status: None   Collection Time: 09/30/17 10:03 AM  Result Value Ref Range   Sodium 138 135 - 145 mmol/L   Potassium 4.7 3.5 - 5.1 mmol/L   Chloride 105 98 - 111 mmol/L   CO2 22 22 - 32 mmol/L   Glucose, Bld 91 70 - 99  mg/dL   BUN 9 6 - 20 mg/dL   Creatinine, Ser 0.62 0.44 - 1.00 mg/dL   Calcium 9.4 8.9 - 10.3 mg/dL   GFR calc non Af Amer >60 >60 mL/min   GFR calc Af Amer >60 >60 mL/min    Comment: (NOTE) The eGFR has been calculated using the CKD EPI equation. This calculation has not been validated in all clinical situations. eGFR's persistently <60 mL/min signify possible Chronic Kidney Disease.    Anion gap 11 5 - 15    Comment: Performed at [facility], [address] [place], [place] [address]  CBC     Status: None   Collection Time: 09/30/17 10:03 AM  Result Value Ref Range   WBC 6.1 4.0 - 10.5 K/uL    Comment: WHITE COUNT CONFIRMED ON SMEAR   RBC 4.77 3.87 - 5.11 MIL/uL   Hemoglobin 14.6 12.0 - 15.0 g/dL   HCT 45.4 36.0 - 46.0 %   MCV 95.2 78.0 - 100.0 fL   MCH 30.6 26.0 - 34.0 pg   MCHC 32.2 30.0 - 36.0 g/dL   RDW 12.2 11.5 - 15.5 %   Platelets  150 - 400 K/uL    PLATELET CLUMPS NOTED ON SMEAR, COUNT APPEARS ADEQUATE    Comment: Performed at [facility], [address] [place], [place] [address]   Dg Lumbar Spine 2-3 Views  Result Date: 09/30/2017 CLINICAL DATA:  Preoperative examination for patient with a lumbar disc herniation. EXAM: LUMBAR SPINE - 2-3 VIEW COMPARISON:  None. FINDINGS: There is no evidence of lumbar spine fracture. Alignment is normal. Intervertebral disc spaces are maintained. IUD is noted. IMPRESSION: Negative exam. Electronically Signed   By: Thomas  Dalessio M.D.   On: 09/30/2017 14:29   Medications: Neurontin  Review of Systems  Constitutional: Negative.   HENT: Negative.   Eyes: Negative.   Respiratory: Negative.   Cardiovascular: Negative.   Gastrointestinal: Negative.   Genitourinary: Negative.   Musculoskeletal: Positive for back pain.  Skin: Negative.   Neurological: Positive for sensory change and focal weakness.  Psychiatric/Behavioral: Negative.     not currently breastfeeding. Physical Exam  Constitutional: She is oriented  to person, place, and time. She appears well-developed and well-nourished. She appears distressed.  HENT:  Head: Normocephalic.  Eyes: Pupils are equal, round, and reactive to light.  Neck: Normal range of motion.  Cardiovascular: Normal rate.  Respiratory: Effort normal.  GI: Soft.  Musculoskeletal:  Patient is a 27-year-old female.  Gait and Station: Appearance: ambulating with no assistive devices and antalgic gait.  Constitutional: General Appearance: healthy-appearing and distress (mild).  Psychiatric: Mood and Affect: active and alert.  Cardiovascular System: Edema Right: none; Dorsalis and posterior tibial pulses 2+. Edema Left: none.  Abdomen: Inspection and Palpation: non-distended and no tenderness.  Skin: Inspection and palpation: no rash.  Lumbar Spine: Inspection: normal alignment. Bony Palpation of the Lumbar Spine: tender at lumbosacral junction.. Bony Palpation of the Right Hip: no tenderness of the greater trochanter and tenderness of the SI joint; Pelvis stable. Bony Palpation of the Left Hip: no tenderness of the greater trochanter and tenderness of the SI joint. Soft Tissue Palpation on the Right: No flank pain with percussion. Active Range of Motion: limited flexion and extention.  Motor Strength: L1 Motor Strength on the Right: hip flexion iliopsoas 5/5. L1 Motor Strength on the Left: hip flexion iliopsoas 5/5. L2-L4 Motor Strength on the Right: knee extension quadriceps 5/5. L2-L4 Motor Strength on the Left: knee extension quadriceps 5/5. L5 Motor Strength on the Right: ankle dorsiflexion tibialis anterior 5/5 and great toe extension extensor hallucis longus 5/5. L5 Motor Strength on the Left: ankle dorsiflexion tibialis anterior 5/5 and great toe extension extensor hallucis longus 3/5. S1 Motor Strength on the Right: plantar flexion gastrocnemius 4/5. S1 Motor Strength on the Left: plantar flexion gastrocnemius 5/5.  Neurological System: Knee Reflex Right: normal  (2). Knee Reflex Left: normal (2). Ankle Reflex Right: normal (2). Ankle Reflex Left: normal (2). Babinski Reflex Right: plantar reflex absent. Babinski Reflex Left: plantar reflex absent. Sensation on the Right: normal distal extremities. Sensation on the Left: decreased sensation on the lateral leg and dorsum of the foot (L5) and dereased sensation on the sole of the foot and the posterior leg (S1) and normal distal extremities. Special Tests on the Right: no clonus of the ankle/knee and seated straight leg raising test positive. Special Tests on the Left: no clonus of the ankle/knee and seated straight leg raising test positive.  Normal cervical lordosis. No pain with range of motion. No palpable tenderness. Motor is 5/5 in all groups in the upper extremities. Upper extremity sensory exam normal. Patient is normoreflexic in the upper extremities. No Hoffmann sign.  Neurological: She is alert and oriented to person, place, and time.    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  Assessment/Plan Patient demonstrates bilateral radiculopathy left greater than right due to disc herniation central and associated stenosis and disc degeneration. She has leg pain over back pain. Neurologic deficit including EHL weakness decreased sensation L5-S1 dermatome decreased plantar flexion.  We discussed options living with her symptoms versus I do not decompression.  She would like to proceed with the latter.  I had an extensive discussion with the patient concerning the pathology relevant anatomy and treatment options. At this point exhausting conservative treatment and in the presence of a neurologic deficit we discussed microlumbar decompression. I discussed the risks and benefits including bleeding, infection, DVT, PE, anesthetic complications, worsening in their symptoms, improvement in their symptoms, C SF  leakage, epidural fibrosis, need for future surgeries such as revision discectomy and lumbar fusion. I also indicated that this is an operation to basically decompress the nerve root to allow recovery as opposed to fixing a herniated disc and that the incidence of recurrent chest disc herniation can approach 15%. Also that nerve root recovery is variable and may not recover completely.  I discussed the operative course including overnight in the hospital. Immediate ambulation. Follow-up in 2 weeks for suture removal. 6 weeks until healing of the herniation followed by 6 weeks of reconditioning and strengthening of the core musculature. Also discussed the need to employ the concepts of disc pressure management and core motion following the surgery to minimize the risk of recurrent disc herniation. We will obtain preoperative clearance i if necessary and proceed accordingly.  We do a central decompression L5-S1. Microdiscectomy.  Discussed possibility of a fusion in the future.  Plan microlumbar decompression L5-S1  Jesenia Spera M., PA-C for Dr. Beane 10/01/2017, 8:50 AM   

## 2017-10-01 NOTE — H&P (View-Only) (Signed)
Glenda Robles is an 27 y.o. female.   Chief Complaint: back and leg pain, left worse than right, with weakness and numbness HPI: Reason for Visit: low back Context: The patient is 15 weeks out from when symptoms began Location (Lower Extremity): lower back pain ; leg pain on the left, , Severity: pain level 7-8/10 Medications: The patient rarely takes gabapentin Notes: The patient is 8 days out from [facility] L5-S1. She reports the injection did not help. Left greater than right pain. Leg pain worsen back pain  Past Medical History:  Diagnosis Date  . HNP (herniated nucleus pulposus), lumbar    L5-S1  . Medical history non-contributory     Past Surgical History:  Procedure Laterality Date  . NO PAST SURGERIES      Family History  Problem Relation Age of Onset  . Diabetes Mother   . Hypertension Mother    Social History:  reports that she has never smoked. She has never used smokeless tobacco. She reports that she does not drink alcohol or use drugs.  Allergies:  Allergies  Allergen Reactions  . Pork-Derived Products     Pt is Muslim    Results for orders placed or performed during the hospital encounter of 09/30/17 (from the past 9 hour(s))  Surgical pcr screen     Status: None   Collection Time: 09/30/17 10:03 AM  Result Value Ref Range   MRSA, PCR NEGATIVE NEGATIVE   Staphylococcus aureus NEGATIVE NEGATIVE    Comment: (NOTE) The Xpert SA Assay (FDA approved for NASAL specimens in patients 89 years of age and older), is one component of a comprehensive surveillance program. It is not intended to diagnose infection nor to guide or monitor treatment. Performed at [facility], [place] [address], [place], [place] [address]   Basic metabolic panel     Status: None   Collection Time: 09/30/17 10:03 AM  Result Value Ref Range   Sodium 138 135 - 145 mmol/L   Potassium 4.7 3.5 - 5.1 mmol/L   Chloride 105 98 - 111 mmol/L   CO2 22 22 - 32 mmol/L   Glucose, Bld 91 70 - 99  mg/dL   BUN 9 6 - 20 mg/dL   Creatinine, Ser 0.62 0.44 - 1.00 mg/dL   Calcium 9.4 8.9 - 10.3 mg/dL   GFR calc non Af Amer >60 >60 mL/min   GFR calc Af Amer >60 >60 mL/min    Comment: (NOTE) The eGFR has been calculated using the CKD EPI equation. This calculation has not been validated in all clinical situations. eGFR's persistently <60 mL/min signify possible Chronic Kidney Disease.    Anion gap 11 5 - 15    Comment: Performed at [facility] [address], [place], [place] [address]  CBC     Status: None   Collection Time: 09/30/17 10:03 AM  Result Value Ref Range   WBC 6.1 4.0 - 10.5 K/uL    Comment: WHITE COUNT CONFIRMED ON SMEAR   RBC 4.77 3.87 - 5.11 MIL/uL   Hemoglobin 14.6 12.0 - 15.0 g/dL   HCT 45.4 36.0 - 46.0 %   MCV 95.2 78.0 - 100.0 fL   MCH 30.6 26.0 - 34.0 pg   MCHC 32.2 30.0 - 36.0 g/dL   RDW 12.2 11.5 - 15.5 %   Platelets  150 - 400 K/uL    PLATELET CLUMPS NOTED ON SMEAR, COUNT APPEARS ADEQUATE    Comment: Performed at [facility], [address] [place], [place] [address]   Dg Lumbar Spine 2-3 Views  Result Date: 09/30/2017 CLINICAL DATA:  Preoperative examination for patient with a lumbar disc herniation. EXAM: LUMBAR SPINE - 2-3 VIEW COMPARISON:  None. FINDINGS: There is no evidence of lumbar spine fracture. Alignment is normal. Intervertebral disc spaces are maintained. IUD is noted. IMPRESSION: Negative exam. Electronically Signed   By: Inge Rise M.D.   On: 09/30/2017 14:29   Medications: Neurontin  Review of Systems  Constitutional: Negative.   HENT: Negative.   Eyes: Negative.   Respiratory: Negative.   Cardiovascular: Negative.   Gastrointestinal: Negative.   Genitourinary: Negative.   Musculoskeletal: Positive for back pain.  Skin: Negative.   Neurological: Positive for sensory change and focal weakness.  Psychiatric/Behavioral: Negative.     not currently breastfeeding. Physical Exam  Constitutional: She is oriented  to person, place, and time. She appears well-developed and well-nourished. She appears distressed.  HENT:  Head: Normocephalic.  Eyes: Pupils are equal, round, and reactive to light.  Neck: Normal range of motion.  Cardiovascular: Normal rate.  Respiratory: Effort normal.  GI: Soft.  Musculoskeletal:  Patient is a 27 year old female.  Gait and Station: Appearance: ambulating with no assistive devices and antalgic gait.  Constitutional: General Appearance: healthy-appearing and distress (mild).  Psychiatric: Mood and Affect: active and alert.  Cardiovascular System: Edema Right: none; Dorsalis and posterior tibial pulses 2+. Edema Left: none.  Abdomen: Inspection and Palpation: non-distended and no tenderness.  Skin: Inspection and palpation: no rash.  Lumbar Spine: Inspection: normal alignment. Bony Palpation of the Lumbar Spine: tender at lumbosacral junction.. Bony Palpation of the Right Hip: no tenderness of the greater trochanter and tenderness of the SI joint; Pelvis stable. Bony Palpation of the Left Hip: no tenderness of the greater trochanter and tenderness of the SI joint. Soft Tissue Palpation on the Right: No flank pain with percussion. Active Range of Motion: limited flexion and extention.  Motor Strength: L1 Motor Strength on the Right: hip flexion iliopsoas 5/5. L1 Motor Strength on the Left: hip flexion iliopsoas 5/5. L2-L4 Motor Strength on the Right: knee extension quadriceps 5/5. L2-L4 Motor Strength on the Left: knee extension quadriceps 5/5. L5 Motor Strength on the Right: ankle dorsiflexion tibialis anterior 5/5 and great toe extension extensor hallucis longus 5/5. L5 Motor Strength on the Left: ankle dorsiflexion tibialis anterior 5/5 and great toe extension extensor hallucis longus 3/5. S1 Motor Strength on the Right: plantar flexion gastrocnemius 4/5. S1 Motor Strength on the Left: plantar flexion gastrocnemius 5/5.  Neurological System: Knee Reflex Right: normal  (2). Knee Reflex Left: normal (2). Ankle Reflex Right: normal (2). Ankle Reflex Left: normal (2). Babinski Reflex Right: plantar reflex absent. Babinski Reflex Left: plantar reflex absent. Sensation on the Right: normal distal extremities. Sensation on the Left: decreased sensation on the lateral leg and dorsum of the foot (L5) and dereased sensation on the sole of the foot and the posterior leg (S1) and normal distal extremities. Special Tests on the Right: no clonus of the ankle/knee and seated straight leg raising test positive. Special Tests on the Left: no clonus of the ankle/knee and seated straight leg raising test positive.  Normal cervical lordosis. No pain with range of motion. No palpable tenderness. Motor is 5/5 in all groups in the upper extremities. Upper extremity sensory exam normal. Patient is normoreflexic in the upper extremities. No Hoffmann sign.  Neurological: She is alert and oriented to person, place, and time.    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  Assessment/Plan Patient demonstrates bilateral radiculopathy left greater than right due to disc herniation central and associated stenosis and disc degeneration. She has leg pain over back pain. Neurologic deficit including EHL weakness decreased sensation L5-S1 dermatome decreased plantar flexion.  We discussed options living with her symptoms versus I do not decompression.  She would like to proceed with the latter.  I had an extensive discussion with the patient concerning the pathology relevant anatomy and treatment options. At this point exhausting conservative treatment and in the presence of a neurologic deficit we discussed microlumbar decompression. I discussed the risks and benefits including bleeding, infection, DVT, PE, anesthetic complications, worsening in their symptoms, improvement in their symptoms, C SF  leakage, epidural fibrosis, need for future surgeries such as revision discectomy and lumbar fusion. I also indicated that this is an operation to basically decompress the nerve root to allow recovery as opposed to fixing a herniated disc and that the incidence of recurrent chest disc herniation can approach 15%. Also that nerve root recovery is variable and may not recover completely.  I discussed the operative course including overnight in the hospital. Immediate ambulation. Follow-up in 2 weeks for suture removal. 6 weeks until healing of the herniation followed by 6 weeks of reconditioning and strengthening of the core musculature. Also discussed the need to employ the concepts of disc pressure management and core motion following the surgery to minimize the risk of recurrent disc herniation. We will obtain preoperative clearance i if necessary and proceed accordingly.  We do a central decompression L5-S1. Microdiscectomy.  Discussed possibility of a fusion in the future.  Plan microlumbar decompression L5-S1  Cecilie Kicks., PA-C for Dr. Tonita Cong 10/01/2017, 8:50 AM

## 2017-10-03 ENCOUNTER — Encounter (HOSPITAL_COMMUNITY): Payer: Self-pay | Admitting: Anesthesiology

## 2017-10-03 ENCOUNTER — Ambulatory Visit (HOSPITAL_COMMUNITY)
Admission: RE | Disposition: A | Discharge status: Home / Self Care | Payer: Self-pay | Source: Ambulatory Visit | Attending: Specialist

## 2017-10-03 ENCOUNTER — Ambulatory Visit (HOSPITAL_COMMUNITY): Payer: Managed Care, Other (non HMO) | Admitting: Anesthesiology

## 2017-10-03 ENCOUNTER — Other Ambulatory Visit: Payer: Self-pay

## 2017-10-03 ENCOUNTER — Ambulatory Visit (HOSPITAL_COMMUNITY)
Admission: RE | Admit: 2017-10-03 | Discharge: 2017-10-04 | Disposition: A | Discharge status: Home / Self Care | Payer: Managed Care, Other (non HMO) | Source: Ambulatory Visit | Attending: Specialist | Admitting: Specialist

## 2017-10-03 ENCOUNTER — Ambulatory Visit (HOSPITAL_COMMUNITY): Payer: Managed Care, Other (non HMO)

## 2017-10-03 DIAGNOSIS — M5116 Intervertebral disc disorders with radiculopathy, lumbar region: Secondary | ICD-10-CM | POA: Insufficient documentation

## 2017-10-03 DIAGNOSIS — M4807 Spinal stenosis, lumbosacral region: Secondary | ICD-10-CM | POA: Insufficient documentation

## 2017-10-03 DIAGNOSIS — M5117 Intervertebral disc disorders with radiculopathy, lumbosacral region: Secondary | ICD-10-CM | POA: Insufficient documentation

## 2017-10-03 DIAGNOSIS — Z419 Encounter for procedure for purposes other than remedying health state, unspecified: Secondary | ICD-10-CM

## 2017-10-03 DIAGNOSIS — M5126 Other intervertebral disc displacement, lumbar region: Secondary | ICD-10-CM

## 2017-10-03 HISTORY — PX: LUMBAR LAMINECTOMY/DECOMPRESSION MICRODISCECTOMY: SHX5026

## 2017-10-03 LAB — PLATELET COUNT: Platelets: 210 10*3/uL (ref 150–400)

## 2017-10-03 LAB — POCT PREGNANCY, URINE: PREG TEST UR: NEGATIVE

## 2017-10-03 SURGERY — LUMBAR LAMINECTOMY/DECOMPRESSION MICRODISCECTOMY 1 LEVEL
Anesthesia: General | Site: Spine Lumbar

## 2017-10-03 MED ORDER — OXYCODONE HCL 5 MG PO TABS: 5.0000 mg | ORAL_TABLET | ORAL | 0 refills | Status: DC | PRN

## 2017-10-03 MED ORDER — PROPOFOL 10 MG/ML IV BOLUS
INTRAVENOUS | Status: DC | PRN
  Administered 2017-10-03: 200 mg via INTRAVENOUS

## 2017-10-03 MED ORDER — BUPIVACAINE-EPINEPHRINE (PF) 0.5% -1:200000 IJ SOLN
INTRAMUSCULAR | Status: AC
  Filled 2017-10-03: qty 30

## 2017-10-03 MED ORDER — ROCURONIUM BROMIDE 50 MG/5ML IV SOSY
PREFILLED_SYRINGE | INTRAVENOUS | Status: DC | PRN
  Administered 2017-10-03: 50 mg via INTRAVENOUS

## 2017-10-03 MED ORDER — MAGNESIUM CITRATE PO SOLN: 1.0000 | Freq: Once | ORAL | Status: DC | PRN

## 2017-10-03 MED ORDER — SODIUM CHLORIDE 0.9 % IV SOLN
INTRAVENOUS | Status: DC | PRN
  Administered 2017-10-03: 35 ug/min via INTRAVENOUS

## 2017-10-03 MED ORDER — HYDROCODONE-ACETAMINOPHEN 5-325 MG PO TABS
1.0000 | ORAL_TABLET | ORAL | Status: DC | PRN
  Administered 2017-10-03 (×2): 1 via ORAL
  Filled 2017-10-03 (×2): qty 1

## 2017-10-03 MED ORDER — PROPOFOL 10 MG/ML IV BOLUS
INTRAVENOUS | Status: AC
  Filled 2017-10-03: qty 20

## 2017-10-03 MED ORDER — ACETAMINOPHEN 650 MG RE SUPP: 650.0000 mg | RECTAL | Status: DC | PRN

## 2017-10-03 MED ORDER — CEFAZOLIN SODIUM-DEXTROSE 2-4 GM/100ML-% IV SOLN
2.0000 g | Freq: Three times a day (TID) | INTRAVENOUS | Status: AC
  Administered 2017-10-03 (×2): 2 g via INTRAVENOUS
  Filled 2017-10-03 (×2): qty 100

## 2017-10-03 MED ORDER — THROMBIN (RECOMBINANT) 20000 UNITS EX SOLR
CUTANEOUS | Status: AC
  Filled 2017-10-03: qty 20000

## 2017-10-03 MED ORDER — FENTANYL CITRATE (PF) 250 MCG/5ML IJ SOLN
INTRAMUSCULAR | Status: AC
  Filled 2017-10-03: qty 5

## 2017-10-03 MED ORDER — OXYCODONE HCL 5 MG PO TABS
5.0000 mg | ORAL_TABLET | Freq: Once | ORAL | Status: AC | PRN
  Administered 2017-10-03: 5 mg via ORAL

## 2017-10-03 MED ORDER — POLYETHYLENE GLYCOL 3350 17 G PO PACK: 17.0000 g | PACK | Freq: Every day | ORAL | Status: DC

## 2017-10-03 MED ORDER — MENTHOL 3 MG MT LOZG: 1.0000 | LOZENGE | OROMUCOSAL | Status: DC | PRN

## 2017-10-03 MED ORDER — ONDANSETRON HCL 4 MG/2ML IJ SOLN
4.0000 mg | Freq: Four times a day (QID) | INTRAMUSCULAR | Status: DC | PRN
  Administered 2017-10-03: 4 mg via INTRAVENOUS
  Filled 2017-10-03: qty 2

## 2017-10-03 MED ORDER — BUPIVACAINE-EPINEPHRINE 0.5% -1:200000 IJ SOLN
INTRAMUSCULAR | Status: DC | PRN
  Administered 2017-10-03: 4 mL

## 2017-10-03 MED ORDER — METHOCARBAMOL 500 MG PO TABS: 500.0000 mg | ORAL_TABLET | Freq: Four times a day (QID) | ORAL | 1 refills | Status: DC | PRN

## 2017-10-03 MED ORDER — METHOCARBAMOL 500 MG PO TABS
500.0000 mg | ORAL_TABLET | Freq: Four times a day (QID) | ORAL | Status: DC | PRN
  Administered 2017-10-03 – 2017-10-04 (×4): 500 mg via ORAL
  Filled 2017-10-03 (×3): qty 1

## 2017-10-03 MED ORDER — TRAMADOL HCL 50 MG PO TABS
50.0000 mg | ORAL_TABLET | Freq: Four times a day (QID) | ORAL | Status: DC | PRN
  Administered 2017-10-04 (×2): 50 mg via ORAL
  Filled 2017-10-03 (×2): qty 1

## 2017-10-03 MED ORDER — ONDANSETRON HCL 4 MG PO TABS: 4.0000 mg | ORAL_TABLET | Freq: Four times a day (QID) | ORAL | Status: DC | PRN

## 2017-10-03 MED ORDER — LACTATED RINGERS IV SOLN: INTRAVENOUS | Status: DC

## 2017-10-03 MED ORDER — THROMBIN 20000 UNITS EX SOLR
CUTANEOUS | Status: DC | PRN
  Administered 2017-10-03: 20 mL via TOPICAL

## 2017-10-03 MED ORDER — HYDROMORPHONE HCL 1 MG/ML IJ SOLN
0.5000 mg | INTRAMUSCULAR | Status: DC | PRN
  Administered 2017-10-03: 0.5 mg via INTRAVENOUS
  Filled 2017-10-03: qty 0.5

## 2017-10-03 MED ORDER — DOCUSATE SODIUM 100 MG PO CAPS
100.0000 mg | ORAL_CAPSULE | Freq: Two times a day (BID) | ORAL | Status: DC
  Administered 2017-10-03: 100 mg via ORAL
  Filled 2017-10-03: qty 1

## 2017-10-03 MED ORDER — GABAPENTIN 300 MG PO CAPS: 300.0000 mg | ORAL_CAPSULE | Freq: Every day | ORAL | Status: DC | PRN

## 2017-10-03 MED ORDER — BISACODYL 5 MG PO TBEC: 5.0000 mg | DELAYED_RELEASE_TABLET | Freq: Every day | ORAL | Status: DC | PRN

## 2017-10-03 MED ORDER — ONDANSETRON HCL 4 MG/2ML IJ SOLN
INTRAMUSCULAR | Status: DC | PRN
  Administered 2017-10-03: 4 mg via INTRAVENOUS

## 2017-10-03 MED ORDER — ACETAMINOPHEN 10 MG/ML IV SOLN
1000.0000 mg | INTRAVENOUS | Status: AC
  Administered 2017-10-03: 1000 mg via INTRAVENOUS
  Filled 2017-10-03: qty 100

## 2017-10-03 MED ORDER — CEFAZOLIN SODIUM-DEXTROSE 2-4 GM/100ML-% IV SOLN
2.0000 g | INTRAVENOUS | Status: AC
  Administered 2017-10-03: 2 g via INTRAVENOUS
  Filled 2017-10-03: qty 100

## 2017-10-03 MED ORDER — FENTANYL CITRATE (PF) 100 MCG/2ML IJ SOLN: 25.0000 ug | INTRAMUSCULAR | Status: DC | PRN

## 2017-10-03 MED ORDER — KCL IN DEXTROSE-NACL 20-5-0.45 MEQ/L-%-% IV SOLN: INTRAVENOUS | Status: AC

## 2017-10-03 MED ORDER — LACTATED RINGERS IV SOLN
INTRAVENOUS | Status: DC | PRN
  Administered 2017-10-03 (×2): via INTRAVENOUS

## 2017-10-03 MED ORDER — DOCUSATE SODIUM 100 MG PO CAPS: 100.0000 mg | ORAL_CAPSULE | Freq: Two times a day (BID) | ORAL | 1 refills | Status: AC

## 2017-10-03 MED ORDER — PHENOL 1.4 % MT LIQD: 1.0000 | OROMUCOSAL | Status: DC | PRN

## 2017-10-03 MED ORDER — ALUM & MAG HYDROXIDE-SIMETH 200-200-20 MG/5ML PO SUSP: 30.0000 mL | Freq: Four times a day (QID) | ORAL | Status: DC | PRN

## 2017-10-03 MED ORDER — POLYETHYLENE GLYCOL 3350 17 G PO PACK: 17.0000 g | PACK | Freq: Every day | ORAL | 0 refills | Status: DC

## 2017-10-03 MED ORDER — ONDANSETRON HCL 4 MG/2ML IJ SOLN
INTRAMUSCULAR | Status: AC
  Administered 2017-10-03: 4 mg
  Filled 2017-10-03: qty 2

## 2017-10-03 MED ORDER — MIDAZOLAM HCL 5 MG/5ML IJ SOLN
INTRAMUSCULAR | Status: DC | PRN
  Administered 2017-10-03: 2 mg via INTRAVENOUS

## 2017-10-03 MED ORDER — METHOCARBAMOL 500 MG PO TABS
ORAL_TABLET | ORAL | Status: AC
  Filled 2017-10-03: qty 1

## 2017-10-03 MED ORDER — SUGAMMADEX SODIUM 200 MG/2ML IV SOLN
INTRAVENOUS | Status: DC | PRN
  Administered 2017-10-03: 140 mg via INTRAVENOUS

## 2017-10-03 MED ORDER — HYDROXYZINE HCL 50 MG/ML IM SOLN
50.0000 mg | Freq: Four times a day (QID) | INTRAMUSCULAR | Status: DC | PRN
  Administered 2017-10-04: 50 mg via INTRAMUSCULAR
  Filled 2017-10-03: qty 1

## 2017-10-03 MED ORDER — FENTANYL CITRATE (PF) 100 MCG/2ML IJ SOLN
INTRAMUSCULAR | Status: DC | PRN
  Administered 2017-10-03 (×4): 50 ug via INTRAVENOUS

## 2017-10-03 MED ORDER — DEXAMETHASONE SODIUM PHOSPHATE 10 MG/ML IJ SOLN
INTRAMUSCULAR | Status: DC | PRN
  Administered 2017-10-03: 10 mg via INTRAVENOUS

## 2017-10-03 MED ORDER — LIDOCAINE 2% (20 MG/ML) 5 ML SYRINGE
INTRAMUSCULAR | Status: DC | PRN
  Administered 2017-10-03: 80 mg via INTRAVENOUS

## 2017-10-03 MED ORDER — PHENYLEPHRINE HCL 10 MG/ML IJ SOLN
INTRAMUSCULAR | Status: DC | PRN
  Administered 2017-10-03 (×3): 80 ug via INTRAVENOUS

## 2017-10-03 MED ORDER — ACETAMINOPHEN 325 MG PO TABS
650.0000 mg | ORAL_TABLET | ORAL | Status: DC | PRN
  Administered 2017-10-03: 650 mg via ORAL
  Filled 2017-10-03: qty 2

## 2017-10-03 MED ORDER — SODIUM CHLORIDE 0.9 % IV SOLN
INTRAVENOUS | Status: DC | PRN
  Administered 2017-10-03: 500 mL

## 2017-10-03 MED ORDER — OXYCODONE HCL 5 MG PO TABS
ORAL_TABLET | ORAL | Status: AC
  Filled 2017-10-03: qty 1

## 2017-10-03 MED ORDER — 0.9 % SODIUM CHLORIDE (POUR BTL) OPTIME
TOPICAL | Status: DC | PRN
  Administered 2017-10-03: 1000 mL

## 2017-10-03 MED ORDER — MIDAZOLAM HCL 2 MG/2ML IJ SOLN
INTRAMUSCULAR | Status: AC
  Filled 2017-10-03: qty 2

## 2017-10-03 MED ORDER — ONDANSETRON HCL 4 MG/2ML IJ SOLN: 4.0000 mg | Freq: Once | INTRAMUSCULAR | Status: DC | PRN

## 2017-10-03 MED ORDER — OXYCODONE HCL 5 MG/5ML PO SOLN: 5.0000 mg | Freq: Once | ORAL | Status: AC | PRN

## 2017-10-03 MED ORDER — METHOCARBAMOL 1000 MG/10ML IJ SOLN
500.0000 mg | Freq: Four times a day (QID) | INTRAVENOUS | Status: DC | PRN
  Filled 2017-10-03: qty 5

## 2017-10-03 MED ORDER — OXYCODONE HCL 5 MG PO TABS
5.0000 mg | ORAL_TABLET | ORAL | Status: DC | PRN
  Administered 2017-10-03 (×2): 5 mg via ORAL
  Filled 2017-10-03 (×2): qty 1

## 2017-10-03 SURGICAL SUPPLY — 57 items
BAG DECANTER FOR FLEXI CONT (MISCELLANEOUS) ×2 IMPLANT
CLEANER TIP ELECTROSURG 2X2 (MISCELLANEOUS) ×2 IMPLANT
CLOTH 2% CHLOROHEXIDINE 3PK (PERSONAL CARE ITEMS) ×2 IMPLANT
CLSR STERI-STRIP ANTIMIC 1/2X4 (GAUZE/BANDAGES/DRESSINGS) ×2 IMPLANT
CONT SPEC 4OZ CLIKSEAL STRL BL (MISCELLANEOUS) ×2 IMPLANT
DRAPE LAPAROTOMY 100X72X124 (DRAPES) ×2 IMPLANT
DRAPE MICROSCOPE LEICA (MISCELLANEOUS) ×2 IMPLANT
DRAPE SHEET LG 3/4 BI-LAMINATE (DRAPES) ×2 IMPLANT
DRAPE SURG 17X11 SM STRL (DRAPES) ×2 IMPLANT
DRAPE UTILITY XL STRL (DRAPES) ×2 IMPLANT
DRSG AQUACEL AG ADV 3.5X 4 (GAUZE/BANDAGES/DRESSINGS) IMPLANT
DRSG AQUACEL AG ADV 3.5X 6 (GAUZE/BANDAGES/DRESSINGS) ×2 IMPLANT
DRSG TELFA 3X8 NADH (GAUZE/BANDAGES/DRESSINGS) IMPLANT
DURAPREP 26ML APPLICATOR (WOUND CARE) ×2 IMPLANT
DURASEAL SPINE SEALANT 3ML (MISCELLANEOUS) IMPLANT
ELECT BLADE 4.0 EZ CLEAN MEGAD (MISCELLANEOUS) ×2
ELECT REM PT RETURN 9FT ADLT (ELECTROSURGICAL) ×2
ELECTRODE BLDE 4.0 EZ CLN MEGD (MISCELLANEOUS) ×1 IMPLANT
ELECTRODE REM PT RTRN 9FT ADLT (ELECTROSURGICAL) ×1 IMPLANT
GLOVE BIOGEL PI IND STRL 7.0 (GLOVE) ×1 IMPLANT
GLOVE BIOGEL PI IND STRL 7.5 (GLOVE) ×3 IMPLANT
GLOVE BIOGEL PI IND STRL 8 (GLOVE) ×2 IMPLANT
GLOVE BIOGEL PI INDICATOR 7.0 (GLOVE) ×1
GLOVE BIOGEL PI INDICATOR 7.5 (GLOVE) ×3
GLOVE BIOGEL PI INDICATOR 8 (GLOVE) ×2
GLOVE SURG SS PI 7.5 STRL IVOR (GLOVE) ×2 IMPLANT
GLOVE SURG SS PI 8.0 STRL IVOR (GLOVE) ×4 IMPLANT
GOWN STRL REUS W/ TWL LRG LVL3 (GOWN DISPOSABLE) ×1 IMPLANT
GOWN STRL REUS W/ TWL XL LVL3 (GOWN DISPOSABLE) ×1 IMPLANT
GOWN STRL REUS W/TWL 2XL LVL3 (GOWN DISPOSABLE) ×2 IMPLANT
GOWN STRL REUS W/TWL LRG LVL3 (GOWN DISPOSABLE) ×1
GOWN STRL REUS W/TWL XL LVL3 (GOWN DISPOSABLE) ×1
IV CATH 14GX2 1/4 (CATHETERS) ×2 IMPLANT
KIT BASIN OR (CUSTOM PROCEDURE TRAY) ×2 IMPLANT
KIT POSITION SURG JACKSON T1 (MISCELLANEOUS) ×2 IMPLANT
NEEDLE 22X1 1/2 (OR ONLY) (NEEDLE) ×2 IMPLANT
NEEDLE SPNL 18GX3.5 QUINCKE PK (NEEDLE) ×6 IMPLANT
PACK LAMINECTOMY NEURO (CUSTOM PROCEDURE TRAY) ×2 IMPLANT
PATTIES SURGICAL .5 X.5 (GAUZE/BANDAGES/DRESSINGS) ×2 IMPLANT
PATTIES SURGICAL .75X.75 (GAUZE/BANDAGES/DRESSINGS) ×2 IMPLANT
RUBBERBAND STERILE (MISCELLANEOUS) ×4 IMPLANT
SPONGE LAP 4X18 RFD (DISPOSABLE) IMPLANT
SPONGE SURGIFOAM ABS GEL 100 (HEMOSTASIS) ×2 IMPLANT
STAPLER VISISTAT (STAPLE) IMPLANT
STRIP CLOSURE SKIN 1/2X4 (GAUZE/BANDAGES/DRESSINGS) ×2 IMPLANT
SUT NURALON 4 0 TR CR/8 (SUTURE) IMPLANT
SUT PROLENE 3 0 PS 2 (SUTURE) ×2 IMPLANT
SUT VIC AB 1 CT1 27 (SUTURE)
SUT VIC AB 1 CT1 27XBRD ANTBC (SUTURE) IMPLANT
SUT VIC AB 1-0 CT2 27 (SUTURE) ×4 IMPLANT
SUT VIC AB 2-0 CT1 27 (SUTURE)
SUT VIC AB 2-0 CT1 TAPERPNT 27 (SUTURE) IMPLANT
SUT VIC AB 2-0 CT2 27 (SUTURE) ×2 IMPLANT
SYR 3ML LL SCALE MARK (SYRINGE) ×2 IMPLANT
TOWEL GREEN STERILE (TOWEL DISPOSABLE) ×2 IMPLANT
TOWEL GREEN STERILE FF (TOWEL DISPOSABLE) ×2 IMPLANT
YANKAUER SUCT BULB TIP NO VENT (SUCTIONS) ×2 IMPLANT

## 2017-10-03 NOTE — Op Note (Signed)
NAMVoncille Lo: Ewy, Jalisha MEDICAL RECORD ZO:[id number] ACCOUNT [id number] DATE OF BIRTH:1990-02-21 FACILITY: [facility] LOCATION: [facility] PHYSICIAN:Taimi Towe Connye Burkitt. Theodosia Bahena, MD  OPERATIVE REPORT  DATE OF PROCEDURE:  10/03/2017  PREOPERATIVE DIAGNOSIS:  Spinal stenosis, herniated nucleus pulposus L5-S1, bilateral L5-S1 radiculopathy.  POSTOPERATIVE DIAGNOSIS:  Spinal stenosis, herniated nucleus pulposus L5-S1, bilateral L5-S1 radiculopathy.  PROCEDURE PERFORMED:   1.  Central decompression L5-S1 bilateral hemilaminotomies with foraminotomies of L5 and S1. 2.  Microdiskectomy L5-S1.  ANESTHESIA:  General.  ASSISTANT:  Andrez GrimeJaclyn Bissell PA.    SPECIMEN:  To pathology.  BRIEF HISTORY:  A 27 year old with bilateral lower extremity radicular pain secondary to L5-S1 nerve root compression due to a central disk herniation with associated disk degeneration.  She was indicated for a decompression failing conservative  treatment, rest, activity modification, therapeutic and injection.  Neural tension signs, EHL and plantarflexion weakness, particularly on the left.  We discussed central decompression with foraminotomies and diskectomy.  Risks and benefits discussed  including bleeding, infection, damage to neurovascular structures, no change in symptoms, worsening symptoms, DVT, PE, anesthetic complications, etc.  DESCRIPTION OF PROCEDURE:  With the patient in supine position.  After induction of adequate anesthesia, 2 grams Kefzol [facility] with a stirrups flexion of the hip.  Bony prominences were well padded.  Lumbar region was prepped  and draped in the usual sterile fashion.  Two 18-gauge spinal needles was utilized to localize the 5-1 interspace confirmed with x-ray.  Incision was made from the spinous process of 5.  Subcutaneous tissue was dissected by electrocautery hemostasis.   Dorsal lumbar fascia divided in line with skin incision.  Paraspinous muscle elevated from lamina of L5 and S1.   McCullough retractor was placed.  Operating microscope was draped and brought in the surgical field.  Confirmatory radiograph obtained.  She  had an increased lumbosacral angle.  A small interlaminar window using straight Leksell rongeur to remove the partial spinous process of L5 and S1 and the interspinous ligament.  Hemilaminotomies were performed at the caudad edge of 5 bilaterally.  She  had a shingling of the S1 vertebral body.  We continued cephalad and used a micro curet to detach the ligamentum flavum from its caudad edge.  In addition, I used a micro curet to detach ligamentum flavum from the cephalad edge of S1.  I protected the  neural elements, removed the ligamentum flavum from the interspace.  There was significant pressure upon the thecal sac centrally and bilaterally.  We continued on both sides to remove the ligamentum flavum and decompress out laterally.  The patient was  particularly tight in the foramen of S1, left greater than right.  After hemilaminotomy was then removal of the ligamentum flavum while continuing to protect the neural sac.  We then went lateral to the S1 nerve root on the left.  The S1 nerve root was  noted to be tented.  I protected the S1 nerve root with a Penfield and used a 15 blade and made an annulotomy lateral to this and then began meticulously mobilizing subannular disk herniation with a nerve hook micropituitary and multiple fragments were  retrieved and sent to pathology.  We were able to then mobilize the S1 nerve root better.  I performed a foraminotomy of S1.  I gently mobilized the S1 nerve root medially without tension and used intermittent release of the nerve root, which was  throughout the case.  After multiple fragments, we used a micro upbiting and Epstein to further mobilize  the disk herniation.  There was a fair amount of osteophyte back here and spurring to the lumbosacral angle.  Large fragments were removed.  I  copiously irrigated the disk  space with lavage and irrigation.  I performed a diskectomy.  We obtained a confirmatory radiograph.  I then looked on the right side.  I gently mobilized the S1 nerve root medially.  I performed a foraminotomy as well.   There was no specific herniation remaining on this side.  There was epidural venous plexus bilaterally and there was epidural leash tethering the S1 nerve root.  This was lysed and mobilized further freeing the S1 nerve root.  A nick at the foramen of L5  bilaterally as well.  I fully detached the ligamentum flavum.  a neuro probe passed freely out the foramen of 5 and S1 below the pedicle of S1 and above the pedicle of 5.  No CSF leakage or active bleeding.  A 1 cm of excursion of the S1 nerve root  medially the pedicle without tension.  I felt we were able to decompress and perform the disk herniation satisfactory.  Next, I placed a small piece of thrombus soaked Gelfoam, one on the left over epidural venous plexus and on the right as well.  I then  removed the McCullough retractor after putting bone wax on the cancellous surface.  No active bleeding or CSF leakage.  I repaired the dorsal fascia with #1 Vicryl in figure-of-eight sutures, subcutaneous with 2-0 and skin with Prolene.  Sterile  dressing applied.  Placed supine on the hospital bed, extubated without difficulty and transported to the recovery room in satisfactory condition.  The patient tolerated the procedure well.  No complications.  Assistant Andrez Grime PA.  Minimal blood loss.  TN/NUANCE  D:10/03/2017 T:10/03/2017 JOB:[id number]

## 2017-10-03 NOTE — Transfer of Care (Signed)
Immediate Anesthesia Transfer of Care Note  Patient: Glenda Robles  Procedure(s) Performed: Microlumbar decompression Lumbar five-Sacral one  (N/A Spine Lumbar)  Patient Location: PACU  Anesthesia Type:General  Level of Consciousness: awake, alert  and oriented  Airway & Oxygen Therapy: Patient Spontanous Breathing and Patient connected to nasal cannula oxygen  Post-op Assessment: Report given to RN, Post -op Vital signs reviewed and stable and Patient moving all extremities X 4  Post vital signs: Reviewed and stable  Last Vitals:  Vitals Value Taken Time  BP 135/97 10/03/2017 10:35 AM  Temp    Pulse 97 10/03/2017 10:35 AM  Resp 27 10/03/2017 10:35 AM  SpO2 100 % 10/03/2017 10:35 AM  Vitals shown include unvalidated device data.  Last Pain:  Vitals:   10/03/17 0628  TempSrc: Oral  PainSc: 5       Patients Stated Pain Goal: 1 (10/03/17 [id number])  Complications: No apparent anesthesia complications

## 2017-10-03 NOTE — Anesthesia Postprocedure Evaluation (Signed)
Anesthesia Post Note  Patient: [name]  Procedure(s) Performed: Microlumbar decompression Lumbar five-Sacral one  (N/A Spine Lumbar)     Patient location during evaluation: PACU Anesthesia Type: General Level of consciousness: awake and alert Pain management: pain level controlled Vital Signs Assessment: post-procedure vital signs reviewed and stable Respiratory status: spontaneous breathing, nonlabored ventilation, respiratory function stable and patient connected to nasal cannula oxygen Cardiovascular status: blood pressure returned to baseline and stable Postop Assessment: no apparent nausea or vomiting Anesthetic complications: no    Last Vitals:  Vitals:   10/03/17 1110 10/03/17 1133  BP:  (!) 127/93  Pulse: 81 67  Resp: (!) 9 16  Temp:  36.4 C  SpO2: 100% 100%    Last Pain:  Vitals:   10/03/17 1133  TempSrc: Oral  PainSc:                  Lucretia Kernarolyn E Witman

## 2017-10-03 NOTE — Anesthesia Preprocedure Evaluation (Addendum)
Anesthesia Evaluation  Patient identified by MRN, date of birth, ID band Patient awake    Reviewed: Allergy & Precautions, H&P , NPO status , Patient's Chart, lab work & pertinent test results  History of Anesthesia Complications Negative for: history of anesthetic complications  Airway Mallampati: II  TM Distance: >3 FB Neck ROM: full    Dental  (+) Teeth Intact, Dental Advidsory Given   Pulmonary neg pulmonary ROS,    Pulmonary exam normal        Cardiovascular negative cardio ROS Normal cardiovascular exam     Neuro/Psych negative neurological ROS  negative psych ROS   GI/Hepatic negative GI ROS, Neg liver ROS,   Endo/Other  negative endocrine ROS  Renal/GU negative Renal ROS  negative genitourinary   Musculoskeletal negative musculoskeletal ROS (+)   Abdominal   Peds  Hematology negative hematology ROS (+)   Anesthesia Other Findings   Reproductive/Obstetrics negative OB ROS                            Anesthesia Physical Anesthesia Plan  ASA: I  Anesthesia Plan: General   Post-op Pain Management:    Induction: Intravenous  PONV Risk Score and Plan: 3 and Ondansetron, Dexamethasone, Midazolam and Treatment may vary due to age or medical condition  Airway Management Planned: Oral ETT  Additional Equipment:   Intra-op Plan:   Post-operative Plan: Extubation in OR  Informed Consent: I have reviewed the patients History and Physical, chart, labs and discussed the procedure including the risks, benefits and alternatives for the proposed anesthesia with the patient or authorized representative who has indicated his/her understanding and acceptance.   Dental Advisory Given  Plan Discussed with:   Anesthesia Plan Comments:        Anesthesia Quick Evaluation

## 2017-10-03 NOTE — Interval H&P Note (Signed)
History and Physical Interval Note:  10/03/2017 7:27 AM  Glenda Robles  has presented today for surgery, with the diagnosis of HNP L5-S1  The various methods of treatment have been discussed with the patient and family. After consideration of risks, benefits and other options for treatment, the patient has consented to  Procedure(s) with comments: Microlumbar decompression L5-S1 (N/A) - 2 hrs as a surgical intervention .  The patient's history has been reviewed, patient examined, no change in status, stable for surgery.  I have reviewed the patient's chart and labs.  Questions were answered to the patient's satisfaction.     [name]

## 2017-10-03 NOTE — Brief Op Note (Signed)
10/03/2017  10:13 AM  PATIENT:  Voncille LoLyna Kiedrowski  27 y.o. female  PRE-OPERATIVE DIAGNOSIS:  HNP L5-S1  POST-OPERATIVE DIAGNOSIS:  HNP L5-S1  PROCEDURE:  Procedure(s): Microlumbar decompression Lumbar five-Sacral one  (N/A)  SURGEON:  Surgeon(s) and Role:    Jene Every* Sigfredo Schreier, MD - Primary  PHYSICIAN ASSISTANT:   ASSISTANTS: Bissell   ANESTHESIA:   general  EBL:  20 mL   BLOOD ADMINISTERED:none  DRAINS: none   LOCAL MEDICATIONS USED:  MARCAINE     SPECIMEN:  Source of Specimen:  L5S1  DISPOSITION OF SPECIMEN:  PATHOLOGY  COUNTS:  YES  TOURNIQUET:  * No tourniquets in log *  DICTATION: .Other Dictation: Dictation Number [id number]  PLAN OF CARE: Admit for overnight observation  PATIENT DISPOSITION:  PACU - hemodynamically stable.   Delay start of Pharmacological VTE agent (>24hrs) due to surgical blood loss or risk of bleeding: yes

## 2017-10-03 NOTE — Discharge Instructions (Signed)

## 2017-10-03 NOTE — Anesthesia Procedure Notes (Signed)
Procedure Name: Intubation Date/Time: 10/03/2017 7:43 AM Performed by: Neldon Newport, CRNA Pre-anesthesia Checklist: Timeout performed, Patient being monitored, Suction available, Emergency Drugs available and Patient identified Patient Re-evaluated:Patient Re-evaluated prior to induction Oxygen Delivery Method: Circle system utilized Preoxygenation: Pre-oxygenation with 100% oxygen Induction Type: IV induction Ventilation: Mask ventilation without difficulty Laryngoscope Size: Mac and 3 Grade View: Grade I Tube type: Oral Tube size: 7.0 mm Number of attempts: 1 Placement Confirmation: breath sounds checked- equal and bilateral,  positive ETCO2 and ETT inserted through vocal cords under direct vision Secured at: 21 cm Tube secured with: Tape Dental Injury: Teeth and Oropharynx as per pre-operative assessment

## 2017-10-04 ENCOUNTER — Encounter (HOSPITAL_COMMUNITY): Payer: Self-pay | Admitting: Specialist

## 2017-10-04 DIAGNOSIS — M5117 Intervertebral disc disorders with radiculopathy, lumbosacral region: Secondary | ICD-10-CM | POA: Diagnosis not present

## 2017-10-04 LAB — BASIC METABOLIC PANEL
Anion gap: 5 (ref 5–15)
BUN: 7 mg/dL (ref 6–20)
CALCIUM: 9.1 mg/dL (ref 8.9–10.3)
CHLORIDE: 107 mmol/L (ref 98–111)
CO2: 26 mmol/L (ref 22–32)
CREATININE: 0.61 mg/dL (ref 0.44–1.00)
GFR calc non Af Amer: >60 mL/min (ref >60)
Glucose, Bld: 138 mg/dL — ABNORMAL HIGH (ref 70–99)
Potassium: 4 mmol/L (ref 3.5–5.1)
SODIUM: 138 mmol/L (ref 135–145)

## 2017-10-04 MED ORDER — TRAMADOL HCL 50 MG PO TABS: 50.0000 mg | ORAL_TABLET | Freq: Four times a day (QID) | ORAL | 1 refills | Status: DC | PRN

## 2017-10-04 MED ORDER — HYDROXYZINE HCL 50 MG PO TABS: 50.0000 mg | ORAL_TABLET | Freq: Four times a day (QID) | ORAL | 1 refills | Status: DC | PRN

## 2017-10-04 MED FILL — Thrombin (Recombinant) For Soln 20000 Unit: CUTANEOUS | Qty: 1 | Status: AC

## 2017-10-04 NOTE — Progress Notes (Signed)
Subjective: 1 Day Post-Op Procedure(s) (LRB): Microlumbar decompression Lumbar five-Sacral one  (N/A) Patient reports pain as mild.  Some pain into thighs bilaterally but biggest c/o back pain. Leg pain less severe than pre-op. Voiding without difficulty. Did not tolerate Oxy or Norco well. Seems to tolerate Ultram the best.   Objective: Vital signs in last 24 hours: Temp:  [97.6 F (36.4 C)-98.4 F (36.9 C)] 98.2 F (36.8 C) (09/13 0345) Pulse Rate:  [67-99] 75 (09/13 0345) Resp:  [8-27] 16 (09/13 0345) BP: (104-137)/(71-97) 104/71 (09/13 0345) SpO2:  [100 %] 100 % (09/13 0345)  Intake/Output from previous day: 09/12 0701 - 09/13 0700 In: 1840 [P.O.:270; I.V.:1170; IV Piggyback:400] Out: 1020 [Emesis/NG output:1000; Blood:20] Intake/Output this shift: No intake/output data recorded.  No results for input(s): HGB in the last 72 hours. Recent Labs    10/03/17 0628  PLT 210   Recent Labs    10/04/17 0334  NA 138  K 4.0  CL 107  CO2 26  BUN 7  CREATININE 0.61  GLUCOSE 138*  CALCIUM 9.1   No results for input(s): LABPT, INR in the last 72 hours.  Neurologically intact ABD soft Neurovascular intact Sensation intact distally Intact pulses distally Dorsiflexion/Plantar flexion intact Incision: dressing C/D/I and no drainage No cellulitis present Compartment soft no calf pain or sign of DVT  Assessment/Plan: 1 Day Post-Op Procedure(s) (LRB): Microlumbar decompression Lumbar five-Sacral one  (N/A) Advance diet Up with therapy D/C IV fluids Discussed D/C instructions, Lspine precautions, dressing instructions D/C home today tramadol for pain. When able to take NSAIDs should be able to back off pain meds as well. Discussed with Dr. Elissa LovettBeane  Glenda Robles, Glenda BarkerJACLYN M. 10/04/2017, 7:49 AM

## 2017-10-04 NOTE — Evaluation (Signed)
Physical Therapy Evaluation Patient Details Name: Glenda Robles MRN: [id number] DOB: [birth year] Today's Date: 10/04/2017   History of Present Illness  This 27 y.o. female admitted for microlumbar decompression L5-S1.  PMH non contributory   Clinical Impression  Patient evaluated by Physical Therapy with no further acute PT needs identified. Patient ambulating hallway distances with no assistive device without difficulty. Able to negotiate 10 steps to prepare for discharge home (main bedroom on second level). Instructed patient on safety with car transfer, walking program recommendations, and stretching. All education has been completed and the patient has no further questions. No follow-up Physical Therapy or equipment needs, but could benefit from outpatient physical therapy after appropriate time frame. PT is signing off. Thank you for this referral.     Follow Up Recommendations No PT follow up    Equipment Recommendations  None recommended by PT    Recommendations for Other Services       Precautions / Restrictions Precautions Precautions: Back Precaution Booklet Issued: Yes (comment) Precaution Comments: PT had provided pt with handouts.  She was able to verbalize 3/3 back precautions  Restrictions Weight Bearing Restrictions: No      Mobility  Bed Mobility Overal bed mobility: Modified Independent             General bed mobility comments: demonstrates safe technique   Transfers Overall transfer level: Modified independent               General transfer comment: slightly increased time required  Ambulation/Gait Ambulation/Gait assistance: Modified independent (Device/Increase time)(increased time) Gait Distance (Feet): 450 Feet Assistive device: None Gait Pattern/deviations: WFL(Within Functional Limits)   Gait velocity interpretation: >2.62 ft/sec, indicative of community ambulatory General Gait Details: Guarded gait with decreased pelvic rotation but  demonstrates good posture and speed  Stairs Stairs: Yes Stairs assistance: Modified independent (Device/Increase time) Stair Management: One rail Left Number of Stairs: 10 General stair comments: step over step technique  Wheelchair Mobility    Modified Rankin (Stroke Patients Only)       Balance Overall balance assessment: No apparent balance deficits (not formally assessed)                                           Pertinent Vitals/Pain Pain Assessment: Faces Faces Pain Scale: Hurts little more Pain Location: back  Pain Descriptors / Indicators: Operative site guarding Pain Intervention(s): Monitored during session    Home Living Family/patient expects to be discharged to:: Private residence Living Arrangements: Spouse/significant other;Children Available Help at Discharge: Family;Available 24 hours/day Type of Home: House Home Access: Stairs to enter   [employer] of Steps: 4 Home Layout: Two level Home Equipment: None Additional Comments: Pt has 8 mos y.o. 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 Pain with full knee extension LLE Deficits / Details: 5/5. Pain with full knee extension    Cervical / Trunk Assessment Cervical / Trunk Assessment:  Normal  Communication   Communication: No difficulties;Prefers language other than English  Cognition Arousal/Alertness: Awake/alert Behavior During Therapy: WFL for tasks assessed/performed Overall Cognitive Status:  Within Functional Limits for tasks assessed                                        General Comments      Exercises Other Exercises Other Exercises: standing calf "runner's" stretch x 2 (30 second hold)   Assessment/Plan    PT Assessment Patent does not need any further PT services  PT Problem List         PT Treatment Interventions      PT Goals (Current goals can be found in the Care Plan section)  Acute Rehab PT Goals Patient Stated Goal: to feel better and be able to go back to work and take care of son  PT Goal Formulation: All assessment and education complete, DC therapy    Frequency     Barriers to discharge        Co-evaluation               AM-PAC PT "6 Clicks" Daily Activity  Outcome Measure Difficulty turning over in bed (including adjusting bedclothes, sheets and blankets)?: None Difficulty moving from lying on back to sitting on the side of the bed? : None Difficulty sitting down on and standing up from a chair with arms (e.g., wheelchair, bedside commode, etc,.)?: A Little Help needed moving to and from a bed to chair (including a wheelchair)?: None Help needed walking in hospital room?: None Help needed climbing 3-5 steps with a railing? : None 6 Click Score: 23    End of Session   Activity Tolerance: Patient tolerated treatment well Patient left: in bed;with call bell/phone within reach;with family/visitor present Nurse Communication: Mobility status PT Visit Diagnosis: Pain;Difficulty in walking, not elsewhere classified (R26.2) Pain - part of body: (back)    Time: [id number] PT Time Calculation (min) (ACUTE ONLY): 15 min   Charges:   PT Evaluation $PT Eval Low Complexity: 1 Low         Laurina Bustlearoline Vedder Brittian, PT, DPT Acute Rehabilitation Services Pager [phone] Office [phone]   Vanetta MuldersCarloine H Danon Lograsso 10/04/2017, 10:18 AM

## 2017-10-04 NOTE — Discharge Summary (Signed)
Physician Discharge Summary   Patient ID: Glenda Robles MRN: [id number] DOB/AGE: 1990/11/04 27 y.o.  Admit date: 10/03/2017 Discharge date: 10/04/2017  Primary Diagnosis:   HNP L5-S1  Admission Diagnoses:  Past Medical History:  Diagnosis Date  . HNP (herniated nucleus pulposus), lumbar    L5-S1  . Medical history non-contributory    Discharge Diagnoses:   Principal Problem:   HNP (herniated nucleus pulposus), lumbar  Procedure:  Procedure(s) (LRB): Microlumbar decompression Lumbar five-Sacral one  (N/A)   Consults: None  HPI:  see H&P    Laboratory Data: Hospital Outpatient Visit on 09/30/2017  Component Date Value Ref Range Status  . MRSA, PCR 09/30/2017 NEGATIVE  NEGATIVE Final  . Staphylococcus aureus 09/30/2017 NEGATIVE  NEGATIVE Final   Comment: (NOTE) The Xpert SA Assay (FDA approved for NASAL specimens in patients 20 years of age and older), is one component of a comprehensive surveillance program. It is not intended to diagnose infection nor to guide or monitor treatment. Performed at [facility], [place] [address] [place], [place] [address]   . Sodium 09/30/2017 138  135 - 145 mmol/L Final  . Potassium 09/30/2017 4.7  3.5 - 5.1 mmol/L Final  . Chloride 09/30/2017 105  98 - 111 mmol/L Final  . CO2 09/30/2017 22  22 - 32 mmol/L Final  . Glucose, Bld 09/30/2017 91  70 - 99 mg/dL Final  . BUN 09/30/2017 9  6 - 20 mg/dL Final  . Creatinine, Ser 09/30/2017 0.62  0.44 - 1.00 mg/dL Final  . Calcium 09/30/2017 9.4  8.9 - 10.3 mg/dL Final  . GFR calc non Af Amer 09/30/2017 >60  >60 mL/min Final  . GFR calc Af Amer 09/30/2017 >60  >60 mL/min Final   Comment: (NOTE) The eGFR has been calculated using the CKD EPI equation. This calculation has not been validated in all clinical situations. eGFR's persistently <60 mL/min signify possible Chronic Kidney Disease.   . Anion gap 09/30/2017 11  5 - 15 Final   Performed at [facility], [place] [address],  [place], [place] [address]  . WBC 09/30/2017 6.1  4.0 - 10.5 K/uL Final   WHITE COUNT CONFIRMED ON SMEAR  . RBC 09/30/2017 4.77  3.87 - 5.11 MIL/uL Final  . Hemoglobin 09/30/2017 14.6  12.0 - 15.0 g/dL Final  . HCT 09/30/2017 45.4  36.0 - 46.0 % Final  . MCV 09/30/2017 95.2  78.0 - 100.0 fL Final  . MCH 09/30/2017 30.6  26.0 - 34.0 pg Final  . MCHC 09/30/2017 32.2  30.0 - 36.0 g/dL Final  . RDW 09/30/2017 12.2  11.5 - 15.5 % Final  . Platelets 09/30/2017 PLATELET CLUMPS NOTED ON SMEAR, COUNT APPEARS ADEQUATE  150 - 400 K/uL Final   Performed at [facility], [place] [address], [place],  [address]   No results for input(s): HGB in the last 72 hours. Recent Labs    10/03/17 0628  PLT 210   Recent Labs    10/04/17 0334  NA 138  K 4.0  CL 107  CO2 26  BUN 7  CREATININE 0.61  GLUCOSE 138*  CALCIUM 9.1   No results for input(s): LABPT, INR in the last 72 hours.  X-Rays:Dg Lumbar Spine 2-3 Views  Result Date: 10/03/2017 CLINICAL DATA:  Microlumbar decompression L5-S1 EXAM: LUMBAR SPINE - 2-3 VIEW COMPARISON:  09/30/2017 FINDINGS: Image performed at 8 o'clock a.m. shows a surgical instrument posterior to the spinous process of L5. A second  instrument is identified posterior to the posterior elements of S1. Image performed 8:15 a.m. demonstrates a posterior retractor in place. A surgical instrument overlies the disc space at L5-S1. Image performed at 9:32 a.m. demonstrates a posterior retractor in place. A surgical instrument overlies the disc space at L5-S1. IMPRESSION: Intraoperative localization. Electronically Signed   By: Nolon Nations M.D.   On: 10/03/2017 10:36   Dg Lumbar Spine 2-3 Views  Result Date: 09/30/2017 CLINICAL DATA:  Preoperative examination for patient with a lumbar disc herniation. EXAM: LUMBAR SPINE - 2-3 VIEW COMPARISON:  None. FINDINGS: There is no evidence of lumbar spine fracture. Alignment is normal. Intervertebral disc spaces are maintained. IUD is  noted. IMPRESSION: Negative exam. Electronically Signed   By: Inge Rise M.D.   On: 09/30/2017 14:29    EKG:No orders found for this or any previous visit.   Hospital Course: Patient was admitted to [facility] and taken to the OR and underwent the above state procedure without complications.  Patient tolerated the procedure well and was later transferred to the recovery room and then to the orthopaedic floor for postoperative care.  They were given PO and IV analgesics for pain control following their surgery.  They were given 24 hours of postoperative antibiotics.   PT was consulted postop to assist with mobility and transfers.  The patient was allowed to be WBAT with therapy and was taught back precautions. Discharge planning was consulted to help with postop disposition and equipment needs.  Patient had a fair night on the evening of surgery and started to get up OOB with therapy on day one.   She did not tolerate narcotic pain meds well but did tolerate Ultram with Hydroxyzine. Patient was seen in rounds and was ready to go home on day one.  They were given discharge instructions and dressing directions.  They were instructed on when to follow up in the office with Dr. Tonita Cong.   Diet: Regular diet Activity:WBAT Follow-up:in 10-14 days Disposition - Home Discharged Condition: good   Discharge Instructions    Call MD / Call 911   Complete by:  As directed    If you experience chest pain or shortness of breath, CALL 911 and be transported to the hospital emergency room.  If you develope a fever above 101 F, pus (white drainage) or increased drainage or redness at the wound, or calf pain, call your surgeon's office.   Constipation Prevention   Complete by:  As directed    Drink plenty of fluids.  Prune juice may be helpful.  You may use a stool softener, such as Colace (over the counter) 100 mg twice a day.  Use MiraLax (over the counter) for constipation as needed.   Diet - low  sodium heart healthy   Complete by:  As directed    Increase activity slowly as tolerated   Complete by:  As directed      Allergies as of 10/04/2017      Reactions   Pork-derived Products Other (See Comments)   Pt is Muslim      Medication List    TAKE these medications   Biotin 5000 MCG Tabs Take 5,000 mcg by mouth daily.   docusate sodium 100 MG capsule Commonly known as:  COLACE Take 1 capsule (100 mg total) by mouth 2 (two) times daily.   gabapentin 300 MG capsule Commonly known as:  NEURONTIN Take 300 mg by mouth daily as needed for pain.   hydrOXYzine 50  MG tablet Commonly known as:  ATARAX/VISTARIL Take 1 tablet (50 mg total) by mouth every 6 (six) hours as needed.   methocarbamol 500 MG tablet Commonly known as:  ROBAXIN Take 1 tablet (500 mg total) by mouth every 6 (six) hours as needed for muscle spasms.   polyethylene glycol packet Commonly known as:  MIRALAX / GLYCOLAX Take 17 g by mouth daily.   traMADol 50 MG tablet Commonly known as:  ULTRAM Take 1-2 tablets (50-100 mg total) by mouth every 6 (six) hours as needed for moderate pain.     May resume anti-inflammatories as needed 5 days post-op Tylenol as needed   Follow-up Information    Susa Day, MD Follow up in 2 week(s).   Specialty:  Orthopedic Surgery Contact information: [address] [place] [address] [phone]           Signed: Lacie Draft, PA-C Orthopaedic Surgery 10/04/2017, 7:55 AM

## 2017-10-04 NOTE — Progress Notes (Signed)
Patient alert and oriented, mae's well, voiding adequate amount of urine, swallowing without difficulty, no c/o pain at time of discharge. Patient discharged home with family. Script and discharged instructions given to patient. Patient and family stated understanding of instructions given. Patient has an appointment with Dr. Beane ?

## 2017-10-04 NOTE — Evaluation (Signed)
Occupational Therapy Evaluation Patient Details Name: Glenda [place] MRN: [id number] DOB: 15-Feb-1990 Today's Date: 10/04/2017    History of Present Illness This 27 y.o. female admitted for microlumbar decompression L5-S1.  PMH non contributory    Clinical Impression   Patient evaluated by Occupational Therapy with no further acute OT needs identified. All education has been completed and the patient has no further questions.  All education completed. See below for any follow-up Occupational Therapy or equipment needs. OT is signing off. Thank you for this referral.      Follow Up Recommendations  No OT follow up;Supervision/Assistance - 24 hour    Equipment Recommendations  None recommended by OT    Recommendations for Other Services       Precautions / Restrictions Precautions Precautions: Back Precaution Booklet Issued: Yes (comment) Precaution Comments: PT had provided pt with handouts.  She was able to verbalize 3/3 back precautions       Mobility Bed Mobility Overal bed mobility: Modified Independent             General bed mobility comments: demonstrates safe technique   Transfers Overall transfer level: Modified independent                    Balance                                           ADL either performed or assessed with clinical judgement   ADL Overall ADL's : Needs assistance/impaired Eating/Feeding: Independent   Grooming: Wash/dry hands;Wash/dry face;Oral care;Brushing hair;Supervision/safety;Standing Grooming Details (indicate cue type and reason): Instructed pt to use cup for brushing teeth to avoid bending  Upper Body Bathing: Supervision/ safety;Set up;Sitting   Lower Body Bathing: Moderate assistance;Sit to/from stand   Upper Body Dressing : Supervision/safety;Set up;Sitting   Lower Body Dressing: Moderate assistance;Sit to/from stand Lower Body Dressing Details (indicate cue type and reason): Pt unable to  cross ankles over knees due to pain.   family will assist her until she is able to do this mod I.  reviewed safe technique  Toilet Transfer: Supervision/safety;Ambulation;Comfort height toilet Toilet Transfer Details (indicate cue type and reason): has vanity next to commode.  Reviewed safe technique  Toileting- Clothing Manipulation and Hygiene: Supervision/safety;Sit to/from stand       Functional mobility during ADLs: Supervision/safety General ADL Comments: spouse very supportive      Financial controllerVision         Perception     Praxis      Pertinent Vitals/Pain Pain Assessment: Faces Faces Pain Scale: Hurts little more Pain Location: back  Pain Descriptors / Indicators: Operative site guarding Pain Intervention(s): Limited activity within patient's tolerance;Monitored during session     Hand Dominance Right   Extremity/Trunk Assessment Upper Extremity Assessment Upper Extremity Assessment: Overall WFL for tasks assessed   Lower Extremity Assessment Lower Extremity Assessment: Defer to PT evaluation   Cervical / Trunk Assessment Cervical / Trunk Assessment: Normal   Communication Communication Communication: No difficulties;Prefers language other than English   Cognition Arousal/Alertness: Awake/alert Behavior During Therapy: WFL for tasks assessed/performed Overall Cognitive Status: Within Functional Limits for tasks assessed                                     General Comments  Exercises     Shoulder Instructions      Home Living Family/patient expects to be discharged to:: Private residence Living Arrangements: Spouse/significant other;Children Available Help at Discharge: Family;Available 24 hours/day               Bathroom Shower/Tub: Producer, television/film/video: Standard     Home Equipment: None   Additional Comments: Pt has 8 mos y.o. baby, and mother will be staying with her to assist until pt is able to regain those  responsibilities independently       Prior Functioning/Environment Level of Independence: Independent        Comments: Pt works in [employer], operational Problem List: Decreased activity tolerance;Pain      OT Treatment/Interventions:      OT Goals(Current goals can be found in the care plan section) Acute Rehab OT Goals Patient Stated Goal: to feel better and be able to go back to work and take care of son  OT Goal Formulation: All assessment and education complete, DC therapy  OT Frequency:     Barriers to D/C:            Co-evaluation              AM-PAC PT "6 Clicks" Daily Activity     Outcome Measure Help from another person eating meals?: None Help from another person taking care of personal grooming?: None Help from another person toileting, which includes using toliet, bedpan, or urinal?: None Help from another person bathing (including washing, rinsing, drying)?: A Little Help from another person to put on and taking off regular upper body clothing?: None Help from another person to put on and taking off regular lower body clothing?: A Lot 6 Click Score: 21   End of Session Nurse Communication: Mobility status  Activity Tolerance: Patient tolerated treatment well Patient left: in bed;with call bell/phone within reach;with family/visitor present  OT Visit Diagnosis: Pain Pain - part of body: (back )                Time: 5784-6962 OT Time Calculation (min): 13 min Charges:  OT General Charges $OT Visit: 1 Visit OT Evaluation $OT Eval Low Complexity: 1 Low  Glenda [place], OTR/L Acute Rehabilitation Services Pager [phone] Office [phone]   Glenda Robles M 10/04/2017, 9:34 AM

## 2017-11-05 ENCOUNTER — Ambulatory Visit: Payer: Managed Care, Other (non HMO) | Admitting: Family Medicine

## 2018-04-01 DIAGNOSIS — T819XXA Unspecified complication of procedure, initial encounter: Secondary | ICD-10-CM | POA: Insufficient documentation

## 2018-04-22 HISTORY — PX: LUMBAR FUSION: SHX111

## 2018-12-26 ENCOUNTER — Ambulatory Visit: Payer: Self-pay | Admitting: *Deleted

## 2018-12-26 ENCOUNTER — Ambulatory Visit (INDEPENDENT_AMBULATORY_CARE_PROVIDER_SITE_OTHER): Payer: Managed Care, Other (non HMO) | Admitting: Family Medicine

## 2018-12-26 ENCOUNTER — Encounter: Payer: Self-pay | Admitting: Family Medicine

## 2018-12-26 VITALS — BP 110/82 | HR 81 | Temp 96.5°F | Ht 63.0 in | Wt 132.0 lb

## 2018-12-26 DIAGNOSIS — Z0001 Encounter for general adult medical examination with abnormal findings: Secondary | ICD-10-CM | POA: Diagnosis not present

## 2018-12-26 DIAGNOSIS — Z0000 Encounter for general adult medical examination without abnormal findings: Secondary | ICD-10-CM

## 2018-12-26 DIAGNOSIS — Z2821 Immunization not carried out because of patient refusal: Secondary | ICD-10-CM

## 2018-12-26 DIAGNOSIS — B351 Tinea unguium: Secondary | ICD-10-CM | POA: Diagnosis not present

## 2018-12-26 LAB — CBC
HCT: 43.2 % (ref 36.0–46.0)
Hemoglobin: 14.8 g/dL (ref 12.0–15.0)
MCHC: 34.2 g/dL (ref 30.0–36.0)
MCV: 92.5 fl (ref 78.0–100.0)
Platelets: 194 10*3/uL (ref 150.0–400.0)
RBC: 4.67 Mil/uL (ref 3.87–5.11)
RDW: 12.7 % (ref 11.5–15.5)
WBC: 4.8 10*3/uL (ref 4.0–10.5)

## 2018-12-26 LAB — LIPID PANEL
Cholesterol: 211 mg/dL — ABNORMAL HIGH (ref 0–200)
HDL: 54.5 mg/dL (ref >39.00)
LDL Cholesterol: 143 mg/dL — ABNORMAL HIGH (ref 0–99)
NonHDL: 156.65
Total CHOL/HDL Ratio: 4
Triglycerides: 66 mg/dL (ref 0.0–149.0)
VLDL: 13.2 mg/dL (ref 0.0–40.0)

## 2018-12-26 LAB — BASIC METABOLIC PANEL
BUN: 12 mg/dL (ref 6–23)
CO2: 27 mEq/L (ref 19–32)
Calcium: 10.2 mg/dL (ref 8.4–10.5)
Chloride: 101 mEq/L (ref 96–112)
Creatinine, Ser: 0.66 mg/dL (ref 0.40–1.20)
GFR: 105.13 mL/min (ref >60.00)
Glucose, Bld: 84 mg/dL (ref 70–99)
Potassium: 3.9 mEq/L (ref 3.5–5.1)
Sodium: 137 mEq/L (ref 135–145)

## 2018-12-26 LAB — ALT: ALT: 17 U/L (ref 0–35)

## 2018-12-26 LAB — AST: AST: 26 U/L (ref 0–37)

## 2018-12-26 MED ORDER — TERBINAFINE HCL 250 MG PO TABS: 250.0000 mg | ORAL_TABLET | Freq: Every day | ORAL | 1 refills | Status: DC

## 2018-12-26 NOTE — Telephone Encounter (Signed)
Noted in patient's chart.

## 2018-12-26 NOTE — Patient Instructions (Signed)
Health Maintenance, Female Adopting a healthy lifestyle and getting preventive care are important in promoting health and wellness. Ask your health care provider about:  The right schedule for you to have regular tests and exams.  Things you can do on your own to prevent diseases and keep yourself healthy. What should I know about diet, weight, and exercise? Eat a healthy diet   Eat a diet that includes plenty of vegetables, fruits, low-fat dairy products, and lean protein.  Do not eat a lot of foods that are high in solid fats, added sugars, or sodium. Maintain a healthy weight Body mass index (BMI) is used to identify weight problems. It estimates body fat based on height and weight. Your health care provider can help determine your BMI and help you achieve or maintain a healthy weight. Get regular exercise Get regular exercise. This is one of the most important things you can do for your health. Most adults should:  Exercise for at least 150 minutes each week. The exercise should increase your heart rate and make you sweat (moderate-intensity exercise).  Do strengthening exercises at least twice a week. This is in addition to the moderate-intensity exercise.  Spend less time sitting. Even light physical activity can be beneficial. Watch cholesterol and blood lipids Have your blood tested for lipids and cholesterol at 30 years of age, then have this test every 5 years. Have your cholesterol levels checked more often if:  Your lipid or cholesterol levels are high.  You are older than 30 years of age.  You are at high risk for heart disease. What should I know about cancer screening? Depending on your health history and family history, you may need to have cancer screening at various ages. This may include screening for:  Breast cancer.  Cervical cancer.  Colorectal cancer.  Skin cancer.  Lung cancer. What should I know about heart disease, diabetes, and high blood  pressure? Blood pressure and heart disease  High blood pressure causes heart disease and increases the risk of stroke. This is more likely to develop in people who have high blood pressure readings, are of African descent, or are overweight.  Have your blood pressure checked: ? Every 3-5 years if you are 18-39 years of age. ? Every year if you are 40 years old or older. Diabetes Have regular diabetes screenings. This checks your fasting blood sugar level. Have the screening done:  Once every three years after age 40 if you are at a normal weight and have a low risk for diabetes.  More often and at a younger age if you are overweight or have a high risk for diabetes. What should I know about preventing infection? Hepatitis B If you have a higher risk for hepatitis B, you should be screened for this virus. Talk with your health care provider to find out if you are at risk for hepatitis B infection. Hepatitis C Testing is recommended for:  Everyone born from 1945 through 1965.  Anyone with known risk factors for hepatitis C. Sexually transmitted infections (STIs)  Get screened for STIs, including gonorrhea and chlamydia, if: ? You are sexually active and are younger than 30 years of age. ? You are older than 30 years of age and your health care provider tells you that you are at risk for this type of infection. ? Your sexual activity has changed since you were last screened, and you are at increased risk for chlamydia or gonorrhea. Ask your health care provider if   you are at risk.  Ask your health care provider about whether you are at high risk for HIV. Your health care provider may recommend a prescription medicine to help prevent HIV infection. If you choose to take medicine to prevent HIV, you should first get tested for HIV. You should then be tested every 3 months for as long as you are taking the medicine. Pregnancy  If you are about to stop having your period (premenopausal) and  you may become pregnant, seek counseling before you get pregnant.  Take 400 to 800 micrograms (mcg) of folic acid every day if you become pregnant.  Ask for birth control (contraception) if you want to prevent pregnancy. Osteoporosis and menopause Osteoporosis is a disease in which the bones lose minerals and strength with aging. This can result in bone fractures. If you are 65 years old or older, or if you are at risk for osteoporosis and fractures, ask your health care provider if you should:  Be screened for bone loss.  Take a calcium or vitamin D supplement to lower your risk of fractures.  Be given hormone replacement therapy (HRT) to treat symptoms of menopause. Follow these instructions at home: Lifestyle  Do not use any products that contain nicotine or tobacco, such as cigarettes, e-cigarettes, and chewing tobacco. If you need help quitting, ask your health care provider.  Do not use street drugs.  Do not share needles.  Ask your health care provider for help if you need support or information about quitting drugs. Alcohol use  Do not drink alcohol if: ? Your health care provider tells you not to drink. ? You are pregnant, may be pregnant, or are planning to become pregnant.  If you drink alcohol: ? Limit how much you use to 0-1 drink a day. ? Limit intake if you are breastfeeding.  Be aware of how much alcohol is in your drink. In the [place] one drink equals one 12 oz bottle of beer (355 mL), one 5 oz glass of wine (148 mL), or one 1 oz glass of hard liquor (44 mL). General instructions  Schedule regular health, dental, and eye exams.  Stay current with your vaccines.  Tell your health care provider if: ? You often feel depressed. ? You have ever been abused or do not feel safe at home. Summary  Adopting a healthy lifestyle and getting preventive care are important in promoting health and wellness.  Follow your health care provider's instructions about healthy  diet, exercising, and getting tested or screened for diseases.  Follow your health care provider's instructions on monitoring your cholesterol and blood pressure. This information is not intended to replace advice given to you by your health care provider. Make sure you discuss any questions you have with your health care provider. Document Released: 07/24/2010 Document Revised: 01/01/2018 Document Reviewed: 01/01/2018 Elsevier Patient Education  2020 [employer]  

## 2018-12-26 NOTE — Progress Notes (Signed)
Glenda Robles is a 28 y.o. female  Chief Complaint  Patient presents with  . New Patient (Initial Visit)  . Annual Exam    wants pap done with her OBGYN  . Flu Vaccine    declines    HPI: Glenda Robles is a 28 y.o. female here as a new patient for her annual CPE, fasting labs. She follows with GYN - Dr. Marvel Plan. She is married, has a son who will be 2yo in 01/2019.   Specialists: Derm - Dr. Renard Hamper; spine surgeon - Dr. Patrice Paradise  Last PAP: UTD  Diet/Exercise: tries to be healthy, does not eat pork; works out 2-3x/wk  Med refills needed today? none   Past Medical History:  Diagnosis Date  . HNP (herniated nucleus pulposus), lumbar    L5-S1  . Medical history non-contributory     Past Surgical History:  Procedure Laterality Date  . LUMBAR LAMINECTOMY/DECOMPRESSION MICRODISCECTOMY N/A 10/03/2017   Procedure: Microlumbar decompression Lumbar five-Sacral one ;  Surgeon: Susa Day, MD;  Location: [facility];  Service: Orthopedics;  Laterality: N/A;  . NO PAST SURGERIES      Social History   Socioeconomic History  . Marital status: Married    Spouse name: Not on file  . Number of children: Not on file  . Years of education: Not on file  . Highest education level: Not on file  Occupational History  . Not on file  Social Needs  . Financial resource strain: Not on file  . Food insecurity    Worry: Not on file    Inability: Not on file  . Transportation needs    Medical: Not on file    Non-medical: Not on file  Tobacco Use  . Smoking status: Never Smoker  . Smokeless tobacco: Never Used  Substance and Sexual Activity  . Alcohol use: No    Frequency: Never  . Drug use: No  . Sexual activity: Yes  Lifestyle  . Physical activity    Days per week: Not on file    Minutes per session: Not on file  . Stress: Not on file  Relationships  . Social Herbalist on phone: Not on file    Gets together: Not on file    Attends religious service: Not on file    Active  member of club or organization: Not on file    Attends meetings of clubs or organizations: Not on file    Relationship status: Not on file  . Intimate partner violence    Fear of current or ex partner: Not on file    Emotionally abused: Not on file    Physically abused: Not on file    Forced sexual activity: Not on file  Other Topics Concern  . Not on file  Social History Narrative  . Not on file    Family History  Problem Relation Age of Onset  . Diabetes Mother   . Hypertension Mother      There is no immunization history for the selected administration types on file for this patient.  Outpatient Encounter Medications as of 12/26/2018  Medication Sig Note  . Biotin 5000 MCG TABS Take 5,000 mcg by mouth daily. 09/27/2017: On hold due to upcoming procedure.  Marland Kitchen spironolactone (ALDACTONE) 25 MG tablet Take 25 mg by mouth daily.   . [DISCONTINUED] gabapentin (NEURONTIN) 300 MG capsule Take 300 mg by mouth daily as needed for pain.   . [DISCONTINUED] hydrOXYzine (ATARAX/VISTARIL) 50 MG tablet Take 1  tablet (50 mg total) by mouth every 6 (six) hours as needed. (Patient not taking: Reported on 12/26/2018)   . [DISCONTINUED] methocarbamol (ROBAXIN) 500 MG tablet Take 1 tablet (500 mg total) by mouth every 6 (six) hours as needed for muscle spasms. (Patient not taking: Reported on 12/26/2018)   . [DISCONTINUED] polyethylene glycol (MIRALAX) packet Take 17 g by mouth daily. (Patient not taking: Reported on 12/26/2018)   . [DISCONTINUED] traMADol (ULTRAM) 50 MG tablet Take 1-2 tablets (50-100 mg total) by mouth every 6 (six) hours as needed for moderate pain. (Patient not taking: Reported on 12/26/2018)    No facility-administered encounter medications on file as of 12/26/2018.      ROS: Gen: no fever, chills  Skin: no rash, itching ENT: no ear pain, ear drainage, nasal congestion, rhinorrhea, sinus pressure, sore throat Eyes: no blurry vision, double vision Resp: no cough, wheeze,SOB CV: no  CP, palpitations, LE edema,  GI: no heartburn, n/v/d/c, abd pain GU: no dysuria, urgency, frequency, hematuria MSK: no joint pain, myalgias, back pain Neuro: no dizziness, headache, weakness Psych: no depression, anxiety, insomnia   Allergies  Allergen Reactions  . Pork-Derived Products Other (See Comments)    Pt is Muslim    BP 110/82   Pulse 81   Temp (!) 96.5 F (35.8 C)   Ht 5\' 3"  (1.6 m)   Wt 132 lb (59.9 kg)   SpO2 98%   BMI 23.38 kg/m   Physical Exam  Constitutional: She is oriented to person, place, and time. She appears well-developed and well-nourished. No distress.  HENT:  Head: Normocephalic and atraumatic.  Right Ear: Tympanic membrane and ear canal normal.  Left Ear: Tympanic membrane and ear canal normal.  Nose: Nose normal.  Mouth/Throat: Oropharynx is clear and moist and mucous membranes are normal.  Eyes: Pupils are equal, round, and reactive to light. Conjunctivae are normal.  Neck: Neck supple. No thyromegaly present.  Cardiovascular: Normal rate, regular rhythm, normal heart sounds and intact distal pulses.  No murmur heard. Pulmonary/Chest: Effort normal and breath sounds normal. No respiratory distress. She has no wheezes. She has no rhonchi.  Abdominal: Soft. Bowel sounds are normal. She exhibits no distension and no mass. There is no abdominal tenderness.  Musculoskeletal:        General: No edema.  Lymphadenopathy:    She has no cervical adenopathy.  Neurological: She is alert and oriented to person, place, and time. She exhibits normal muscle tone. Coordination normal.  Skin: Skin is warm and dry.  Lt great toe with dark discoloration  Psychiatric: She has a normal mood and affect. Her behavior is normal.     A/P:  1. Annual physical exam - UTD on PAP - decline flu and Tdap vaccines - discussed importance of regular CV exercise, healthy diet, adequate sleep - ALT - AST - Basic metabolic panel - CBC - Lipid panel - next CPE in 1  year  2. Influenza vaccination declined by patient  3. Onychomycosis of toenail Rx: - terbinafine (LAMISIL) 250 MG tablet; Take 1 tablet (250 mg total) by mouth daily.  Dispense: 90 tablet; Refill: 1 - ALT - AST - f/u in 47mo or sooner PRN  This visit occurred during the SARS-CoV-2 public health emergency.  Safety protocols were in place, including screening questions prior to the visit, additional usage of staff PPE, and extensive cleaning of exam room while observing appropriate contact time as indicated for disinfecting solutions.

## 2018-12-26 NOTE — Telephone Encounter (Signed)
Pt given lab results and recommendations per notes of Dr. Bryan Lemma on 12/26/18. Pt verbalized understanding. Unable to document in result note.

## 2018-12-26 NOTE — Telephone Encounter (Signed)
Please note in pt's result note

## 2019-04-29 ENCOUNTER — Other Ambulatory Visit: Payer: Self-pay

## 2019-04-29 DIAGNOSIS — B351 Tinea unguium: Secondary | ICD-10-CM

## 2019-04-29 MED ORDER — TERBINAFINE HCL 250 MG PO TABS: 250.0000 mg | ORAL_TABLET | Freq: Every day | ORAL | 1 refills | Status: DC

## 2019-04-29 NOTE — Telephone Encounter (Signed)
Last OV 12/26/18 Last fill 12/26/18  #90/1 Next OV 05/01/19

## 2019-04-30 ENCOUNTER — Other Ambulatory Visit: Payer: Self-pay

## 2019-05-01 ENCOUNTER — Encounter: Payer: Self-pay | Admitting: Family Medicine

## 2019-05-01 ENCOUNTER — Ambulatory Visit (INDEPENDENT_AMBULATORY_CARE_PROVIDER_SITE_OTHER): Payer: Managed Care, Other (non HMO) | Admitting: Family Medicine

## 2019-05-01 VITALS — BP 118/80 | HR 96 | Temp 97.8°F | Ht 63.0 in | Wt 131.0 lb

## 2019-05-01 DIAGNOSIS — M5126 Other intervertebral disc displacement, lumbar region: Secondary | ICD-10-CM | POA: Diagnosis not present

## 2019-05-01 DIAGNOSIS — R29898 Other symptoms and signs involving the musculoskeletal system: Secondary | ICD-10-CM | POA: Diagnosis not present

## 2019-05-01 DIAGNOSIS — M5416 Radiculopathy, lumbar region: Secondary | ICD-10-CM

## 2019-05-01 NOTE — Progress Notes (Signed)
Glenda Robles is a 29 y.o. female  Chief Complaint  Patient presents with  . Leg Pain    Pt c/o of both legs in pain but more on the rt leg, x47month    HPI: Glenda Robles is a 29 y.o. female who complains of B/L leg pain Rt > Lt, low back pain, intermittent weakness in LE. Pain is described as "electrical" from her back all the way down her leg.  She does have a h/o lumbar laminectomy x 2 d/t herniated disc L5-S1. Last surgery in 03/2018, Dr. Noel Gerold. She had f/u with Dr. Noel Gerold 2 wks ago and he scheduled her for MRI and gave her Rx for medrol dose pack - no relief. She was advised to see PCP for referral to neurology.   Past Medical History:  Diagnosis Date  . HNP (herniated nucleus pulposus), lumbar    L5-S1  . Medical history non-contributory     Past Surgical History:  Procedure Laterality Date  . LUMBAR LAMINECTOMY/DECOMPRESSION MICRODISCECTOMY N/A 10/03/2017   Procedure: Microlumbar decompression Lumbar five-Sacral one ;  Surgeon: Jene Every, MD;  Location: [facility];  Service: Orthopedics;  Laterality: N/A;  . NO PAST SURGERIES      Social History   Socioeconomic History  . Marital status: Married    Spouse name: Not on file  . Number of children: Not on file  . Years of education: Not on file  . Highest education level: Not on file  Occupational History  . Not on file  Tobacco Use  . Smoking status: Never Smoker  . Smokeless tobacco: Never Used  Substance and Sexual Activity  . Alcohol use: No  . Drug use: No  . Sexual activity: Yes  Other Topics Concern  . Not on file  Social History Narrative  . Not on file   Social Determinants of Health   Financial Resource Strain:   . Difficulty of Paying Living Expenses:   Food Insecurity:   . Worried About Programme researcher, broadcasting/film/video in the Last Year:   . Barista in the Last Year:   Transportation Needs:   . Freight forwarder (Medical):   Marland Kitchen Lack of Transportation (Non-Medical):   Physical Activity:   . Days of  Exercise per Week:   . Minutes of Exercise per Session:   Stress:   . Feeling of Stress :   Social Connections:   . Frequency of Communication with Friends and Family:   . Frequency of Social Gatherings with Friends and Family:   . Attends Religious Services:   . Active Member of Clubs or Organizations:   . Attends Banker Meetings:   Marland Kitchen Marital Status:   Intimate Partner Violence:   . Fear of Current or Ex-Partner:   . Emotionally Abused:   Marland Kitchen Physically Abused:   . Sexually Abused:     Family History  Problem Relation Age of Onset  . Diabetes Mother   . Hypertension Mother      There is no immunization history for the selected administration types on file for this patient.  Outpatient Encounter Medications as of 05/01/2019  Medication Sig Note  . spironolactone (ALDACTONE) 25 MG tablet Take 25 mg by mouth daily.   Marland Kitchen terbinafine (LAMISIL) 250 MG tablet Take 1 tablet (250 mg total) by mouth daily.   . Biotin 5000 MCG TABS Take 5,000 mcg by mouth daily. 09/27/2017: On hold due to upcoming procedure.   No facility-administered encounter medications on file  as of 05/01/2019.     ROS: Pertinent positives and negatives noted in HPI. Remainder of ROS non-contributory    Allergies  Allergen Reactions  . Pork-Derived Products Other (See Comments)    Pt is Muslim    BP 118/80 (BP Location: Left Arm, Patient Position: Sitting, Cuff Size: Normal)   Pulse 96   Temp 97.8 F (36.6 C) (Temporal)   Ht 5\' 3"  (1.6 m)   Wt 131 lb (59.4 kg)   SpO2 99%   BMI 23.21 kg/m   Physical Exam  Constitutional: She is oriented to person, place, and time. She appears well-developed and well-nourished. No distress.  Cardiovascular: Normal rate and regular rhythm.  Pulmonary/Chest: Effort normal and breath sounds normal. No respiratory distress.  Neurological: She is alert and oriented to person, place, and time.  Psychiatric: She has a normal mood and affect. Her behavior is normal.      A/P:  1. HNP (herniated nucleus pulposus), lumbar 2. Lumbar back pain with radiculopathy affecting lower extremity 3. Leg weakness, bilateral - Ambulatory referral to Neurology - cont f/u with Dr. Patrice Paradise and MRI as recommended  This visit occurred during the SARS-CoV-2 public health emergency.  Safety protocols were in place, including screening questions prior to the visit, additional usage of staff PPE, and extensive cleaning of exam room while observing appropriate contact time as indicated for disinfecting solutions.

## 2019-05-04 ENCOUNTER — Telehealth: Payer: Self-pay | Admitting: Family Medicine

## 2019-05-04 DIAGNOSIS — R29898 Other symptoms and signs involving the musculoskeletal system: Secondary | ICD-10-CM

## 2019-05-04 DIAGNOSIS — R202 Paresthesia of skin: Secondary | ICD-10-CM

## 2019-05-04 DIAGNOSIS — R200 Anesthesia of skin: Secondary | ICD-10-CM

## 2019-05-04 NOTE — Telephone Encounter (Signed)
Patient is calling and wanted to speak to someone regarding get another referral to see a Neurologist. Patient stated that the office she went to stated that they don't treat back pain. Pls advise. CB is [phone]

## 2019-05-04 NOTE — Telephone Encounter (Signed)
I spoke with pt and informed her that I would send Dr. Salena Saner is out of office until Wed.

## 2019-05-06 NOTE — Telephone Encounter (Signed)
Patient verbally understood referral sent to [facility] and agrees to aske Dr. Noel Gerold recommendations if no luck with [facility].

## 2019-05-06 NOTE — Telephone Encounter (Signed)
Referral placed to [facility] If there is an issue with pt being seen there, pt may want to ask Dr. Noel Gerold for recommendation and/or referral

## 2019-05-22 ENCOUNTER — Telehealth: Payer: Self-pay | Admitting: Family Medicine

## 2019-05-22 ENCOUNTER — Encounter: Payer: Self-pay | Admitting: Family Medicine

## 2019-05-22 NOTE — Telephone Encounter (Signed)
I sent a Mychart message informing pt of Dr. Renaye Rakers feedback.

## 2019-05-22 NOTE — Telephone Encounter (Signed)
She was referred to [facility] neurology who declined the referral and then to [facility]. It appears she is scheduled but pt wants sooner appt. She may want to ask her neurosurgeon who recommended she see neuro if he has any recommendations. She could try [facility] but may be worth calling the office first to see when the earliest new pt appt is

## 2019-05-22 NOTE — Telephone Encounter (Signed)
Patient's husband called and requested a different neurology referral for Glenda Robles. The office they were referred to already does not have an appt available soon enough.

## 2019-05-26 ENCOUNTER — Ambulatory Visit (INDEPENDENT_AMBULATORY_CARE_PROVIDER_SITE_OTHER): Payer: Managed Care, Other (non HMO) | Admitting: Diagnostic Neuroimaging

## 2019-05-26 ENCOUNTER — Other Ambulatory Visit: Payer: Self-pay

## 2019-05-26 ENCOUNTER — Encounter: Payer: Self-pay | Admitting: Diagnostic Neuroimaging

## 2019-05-26 VITALS — BP 120/77 | HR 93 | Temp 97.4°F | Ht 63.0 in | Wt 127.4 lb

## 2019-05-26 DIAGNOSIS — R531 Weakness: Secondary | ICD-10-CM | POA: Diagnosis not present

## 2019-05-26 DIAGNOSIS — R200 Anesthesia of skin: Secondary | ICD-10-CM | POA: Diagnosis not present

## 2019-05-26 NOTE — Progress Notes (Signed)
[facility]  PATIENT: Glenda Robles DOB: [birth year]  REFERRING CLINICIAN: Overton Mam, DO HISTORY FROM: patient  REASON FOR VISIT: new consult    HISTORICAL  CHIEF COMPLAINT:  Chief Complaint  Patient presents with  . Numbness    rm 7 New Pt  "numbness, tingling in right leg/ arm, occas in left leg too"    HISTORY OF PRESENT ILLNESS:   29 year old female here for evaluation of numbness and weakness.  2019 patient had low back pain radiating to the right leg.  She was diagnosed with disc herniation and treated with discectomy.  Symptoms slightly improved but then recurred.  She followed up with another spine surgeon in 2020 and underwent lumbar decompression and fusion.  Symptoms improved for a while but then recurred.  Over the past 2 3 months patient has had ongoing intermittent episodes of electrical sensation, numbness and weakness, mainly affecting her right arm and right leg.  She feels like the connection between her brain and her arm and leg are interrupted.  Sometimes she is not able to move her right arm or right leg for several minutes at a time.  Patient had 1 of these episodes at the end of our office visit today.  Patient is having the spells several times per week.   REVIEW OF SYSTEMS: Full 14 system review of systems performed and negative with exception of: As per HPI.  ALLERGIES: Allergies  Allergen Reactions  . Pork-Derived Products Other (See Comments)    Pt is Muslim    HOME MEDICATIONS: Outpatient Medications Prior to Visit  Medication Sig Dispense Refill  . Biotin 5000 MCG TABS Take 5,000 mcg by mouth daily.    Marland Kitchen spironolactone (ALDACTONE) 25 MG tablet Take 25 mg by mouth daily.    Marland Kitchen terbinafine (LAMISIL) 250 MG tablet Take 1 tablet (250 mg total) by mouth daily. 90 tablet 1   No facility-administered medications prior to visit.    PAST MEDICAL HISTORY: Past Medical History:  Diagnosis Date  . HNP (herniated nucleus  pulposus), lumbar    L5-S1  . Medical history non-contributory     PAST SURGICAL HISTORY: Past Surgical History:  Procedure Laterality Date  . LUMBAR FUSION  04/22/2018  . LUMBAR LAMINECTOMY/DECOMPRESSION MICRODISCECTOMY N/A 10/03/2017   Procedure: Microlumbar decompression Lumbar five-Sacral one ;  Surgeon: Jene Every, MD;  Location: [facility];  Service: Orthopedics;  Laterality: N/A;    FAMILY HISTORY: Family History  Problem Relation Age of Onset  . Diabetes Mother   . Hypertension Mother     SOCIAL HISTORY: Social History   Socioeconomic History  . Marital status: Married    Spouse name: Not on file  . Number of children: Not on file  . Years of education: Not on file  . Highest education level: Master's degree (e.g., MA, MS, MEng, MEd, MSW, MBA)  Occupational History  . Not on file  Tobacco Use  . Smoking status: Never Smoker  . Smokeless tobacco: Never Used  Substance and Sexual Activity  . Alcohol use: No  . Drug use: No  . Sexual activity: Yes  Other Topics Concern  . Not on file  Social History Narrative  . Not on file   Social Determinants of Health   Financial Resource Strain:   . Difficulty of Paying Living Expenses:   Food Insecurity:   . Worried About Programme researcher, broadcasting/film/video in the Last Year:   . [employer] of Food in the Last Year:  Transportation Needs:   . Film/video editor (Medical):   Marland Kitchen Lack of Transportation (Non-Medical):   Physical Activity:   . Days of Exercise per Week:   . Minutes of Exercise per Session:   Stress:   . Feeling of Stress :   Social Connections:   . Frequency of Communication with Friends and Family:   . Frequency of Social Gatherings with Friends and Family:   . Attends Religious Services:   . Active Member of Clubs or Organizations:   . Attends Archivist Meetings:   Marland Kitchen Marital Status:   Intimate Partner Violence:   . Fear of Current or Ex-Partner:   . Emotionally Abused:   Marland Kitchen Physically Abused:   .  Sexually Abused:      PHYSICAL EXAM  GENERAL EXAM/CONSTITUTIONAL: Vitals:  Vitals:   05/26/19 1505  BP: 120/77  Pulse: 93  Temp: (!) 97.4 F (36.3 C)  Weight: 127 lb 6.4 oz (57.8 kg)  Height: 5\' 3"  (1.6 m)     Body mass index is 22.57 kg/m. Wt Readings from Last 3 Encounters:  05/26/19 127 lb 6.4 oz (57.8 kg)  05/01/19 131 lb (59.4 kg)  12/26/18 132 lb (59.9 kg)     Patient is in no distress; well developed, nourished and groomed; neck is supple  CARDIOVASCULAR:  Examination of carotid arteries is normal; no carotid bruits  Regular rate and rhythm, no murmurs  Examination of peripheral vascular system by observation and palpation is normal  EYES:  Ophthalmoscopic exam of optic discs and posterior segments is normal; no papilledema or hemorrhages  No exam data present  MUSCULOSKELETAL:  Gait, strength, tone, movements noted in Neurologic exam below  NEUROLOGIC: MENTAL STATUS:  No flowsheet data found.  awake, alert, oriented to person, place and time  recent and remote memory intact  normal attention and concentration  language fluent, comprehension intact, naming intact  fund of knowledge appropriate  CRANIAL NERVE:   2nd - no papilledema on fundoscopic exam  2nd, 3rd, 4th, 6th - pupils equal and reactive to light, visual fields full to confrontation, extraocular muscles intact, no nystagmus  5th - facial sensation symmetric  7th - facial strength symmetric  8th - hearing intact  9th - palate elevates symmetrically, uvula midline  11th - shoulder shrug symmetric  12th - tongue protrusion midline  MOTOR:   normal bulk and tone, full strength in the BUE, BLE  SENSORY:   normal and symmetric to light touch, temperature, vibration  COORDINATION:   finger-nose-finger, fine finger movements normal  REFLEXES:   deep tendon reflexes present and symmetric  GAIT/STATION:   narrow based gait     DIAGNOSTIC DATA (LABS,  IMAGING, TESTING) - I reviewed patient records, labs, notes, testing and imaging myself where available.  Lab Results  Component Value Date   WBC 4.8 12/26/2018   HGB 14.8 12/26/2018   HCT 43.2 12/26/2018   MCV 92.5 12/26/2018   PLT 194.0 12/26/2018      Component Value Date/Time   NA 137 12/26/2018 0912   K 3.9 12/26/2018 0912   CL 101 12/26/2018 0912   CO2 27 12/26/2018 0912   GLUCOSE 84 12/26/2018 0912   BUN 12 12/26/2018 0912   CREATININE 0.66 12/26/2018 0912   CALCIUM 10.2 12/26/2018 0912   AST 26 12/26/2018 0912   ALT 17 12/26/2018 0912   GFRNONAA >60 10/04/2017 0334   GFRAA >60 10/04/2017 0334   Lab Results  Component Value Date   CHOL  211 (H) 12/26/2018   HDL 54.50 12/26/2018   LDLCALC 143 (H) 12/26/2018   TRIG 66.0 12/26/2018   CHOLHDL 4 12/26/2018   No results found for: HGBA1C No results found for: VITAMINB12 No results found for: TSH     ASSESSMENT AND PLAN  29 y.o. year old female here with intermittent lower extremity electrical sensations, weakness; intermittent right hand numbness / weakness.  Patient had 1 of these events at the end of our office visit today.  Of note patient seemed to have giveaway weakness and normal muscle tone during this attack, raising possibility of stress or psychogenic reaction.  We will proceed with further work-up to rule out other organic causes such as demyelinating disease or vascular causes.  Dx:  1. Weakness   2. Numbness     PLAN:  INTERMITTENT NUMBNESS / WEAKNESS (RUE, RLE) - check MRI brain / cervical spine (rule out demyelinating dz) - check labs (b12, TSH, a1c)  Orders Placed This Encounter  Procedures  . MR BRAIN W WO CONTRAST  . MR CERVICAL SPINE W WO CONTRAST  . Vitamin B12  . Hemoglobin A1c  . TSH   Return for pending if symptoms worsen or fail to improve.    Suanne Marker, MD 05/26/2019, 3:27 PM Certified in Neurology, Neurophysiology and Neuroimaging  [facility] [address] [place], [place] [address] [phone]

## 2019-05-27 ENCOUNTER — Encounter: Payer: Self-pay | Admitting: *Deleted

## 2019-05-27 ENCOUNTER — Telehealth: Payer: Self-pay | Admitting: Diagnostic Neuroimaging

## 2019-05-27 LAB — HEMOGLOBIN A1C
Est. average glucose Bld gHb Est-mCnc: 100 mg/dL
Hgb A1c MFr Bld: 5.1 % (ref 4.8–5.6)

## 2019-05-27 LAB — VITAMIN B12: Vitamin B-12: 550 pg/mL (ref 232–1245)

## 2019-05-27 LAB — TSH: TSH: 1.31 u[IU]/mL (ref 0.450–4.500)

## 2019-05-27 NOTE — Telephone Encounter (Signed)
[employer] order sent to GI. They will obtain the auth and reach out to the patient to schedule.  

## 2019-06-14 ENCOUNTER — Ambulatory Visit
Admission: RE | Admit: 2019-06-14 | Discharge: 2019-06-14 | Disposition: A | Discharge status: Home / Self Care | Payer: Managed Care, Other (non HMO) | Source: Ambulatory Visit | Attending: Diagnostic Neuroimaging | Admitting: Diagnostic Neuroimaging

## 2019-06-14 DIAGNOSIS — R531 Weakness: Secondary | ICD-10-CM | POA: Diagnosis not present

## 2019-06-14 DIAGNOSIS — R200 Anesthesia of skin: Secondary | ICD-10-CM

## 2019-06-14 MED ORDER — GADOBENATE DIMEGLUMINE 529 MG/ML IV SOLN
11.0000 mL | Freq: Once | INTRAVENOUS | Status: AC | PRN
  Administered 2019-06-14: 11 mL via INTRAVENOUS

## 2019-06-16 ENCOUNTER — Encounter: Payer: Self-pay | Admitting: *Deleted

## 2019-06-17 ENCOUNTER — Ambulatory Visit: Payer: Managed Care, Other (non HMO) | Admitting: Diagnostic Neuroimaging

## 2019-07-01 IMAGING — CR DG LUMBAR SPINE 2-3V
1 series · 1 of 1 positions shown · non-contrast
Comparison: 09/30/2017

CLINICAL DATA: Microlumbar decompression L5-S1

EXAM:
LUMBAR SPINE - 2-3 VIEW

[lateral]
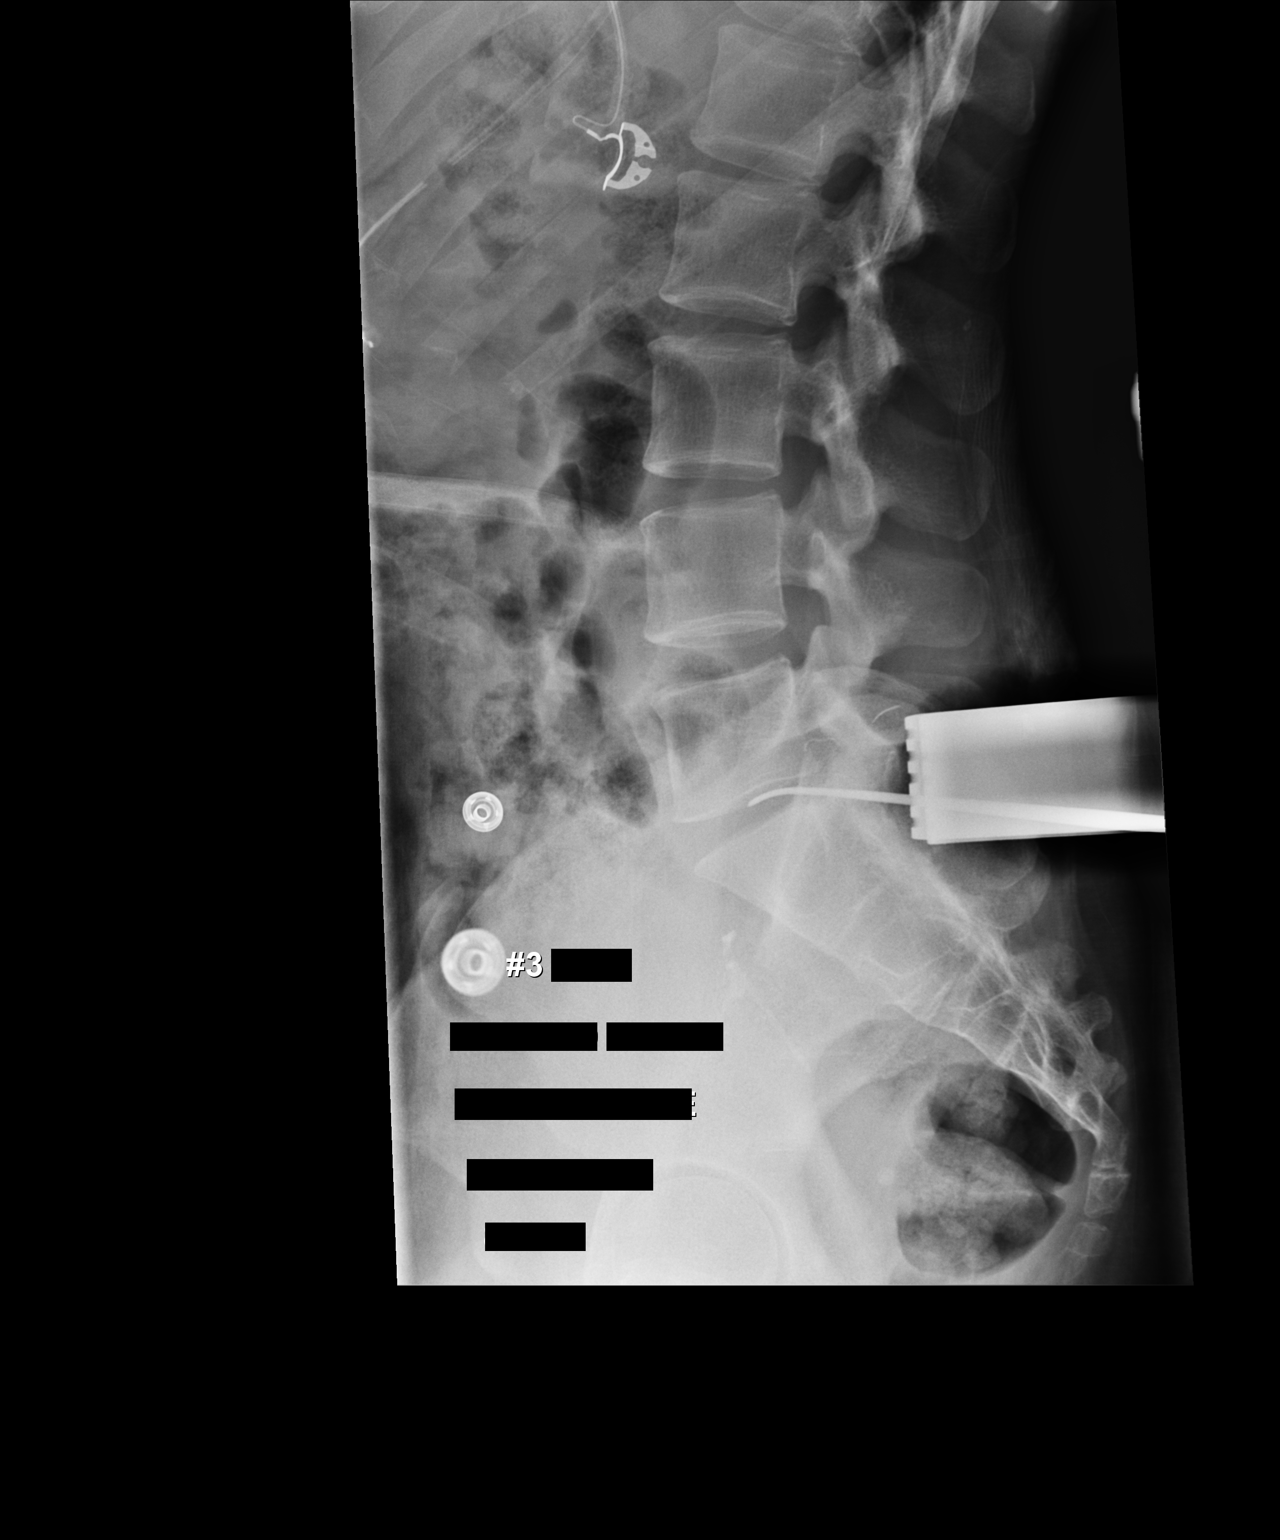

[1 of 1 positions shown; findings below may reference images not displayed]

FINDINGS: Image performed at 8 o'clock a.m. shows a surgical instrument
posterior to the spinous process of L5. A second instrument is
identified posterior to the posterior elements of S1.

Image performed [DATE] a.m. demonstrates a posterior retractor in
place. A surgical instrument overlies the disc space at L5-S1.

Image performed at [DATE] a.m. demonstrates a posterior retractor in
place. A surgical instrument overlies the disc space at L5-S1.
IMPRESSION: Intraoperative localization.

## 2019-08-03 ENCOUNTER — Encounter: Payer: Self-pay | Admitting: Family Medicine

## 2019-12-30 LAB — HM PAP SMEAR

## 2020-06-22 ENCOUNTER — Encounter: Payer: Managed Care, Other (non HMO) | Admitting: Family Medicine

## 2020-06-24 ENCOUNTER — Encounter: Payer: Managed Care, Other (non HMO) | Admitting: Family Medicine

## 2020-08-04 ENCOUNTER — Other Ambulatory Visit: Payer: Self-pay

## 2020-08-05 ENCOUNTER — Encounter: Payer: Self-pay | Admitting: Family Medicine

## 2020-08-05 ENCOUNTER — Ambulatory Visit (INDEPENDENT_AMBULATORY_CARE_PROVIDER_SITE_OTHER): Payer: Managed Care, Other (non HMO) | Admitting: Family Medicine

## 2020-08-05 VITALS — BP 112/80 | HR 93 | Temp 98.1°F | Ht 62.0 in | Wt 144.4 lb

## 2020-08-05 DIAGNOSIS — Z0000 Encounter for general adult medical examination without abnormal findings: Secondary | ICD-10-CM | POA: Diagnosis not present

## 2020-08-05 DIAGNOSIS — Z13220 Encounter for screening for lipoid disorders: Secondary | ICD-10-CM | POA: Diagnosis not present

## 2020-08-05 DIAGNOSIS — Z1329 Encounter for screening for other suspected endocrine disorder: Secondary | ICD-10-CM | POA: Diagnosis not present

## 2020-08-05 NOTE — Patient Instructions (Signed)
Health Maintenance, Female Adopting a healthy lifestyle and getting preventive care are important in promoting health and wellness. Ask your health care provider about: The right schedule for you to have regular tests and exams. Things you can do on your own to prevent diseases and keep yourself healthy. What should I know about diet, weight, and exercise? Eat a healthy diet  Eat a diet that includes plenty of vegetables, fruits, low-fat dairy products, and lean protein. Do not eat a lot of foods that are high in solid fats, added sugars, or sodium.  Maintain a healthy weight Body mass index (BMI) is used to identify weight problems. It estimates body fat based on height and weight. Your health care provider can help determineyour BMI and help you achieve or maintain a healthy weight. Get regular exercise Get regular exercise. This is one of the most important things you can do for your health. Most adults should: Exercise for at least 150 minutes each week. The exercise should increase your heart rate and make you sweat (moderate-intensity exercise). Do strengthening exercises at least twice a week. This is in addition to the moderate-intensity exercise. Spend less time sitting. Even light physical activity can be beneficial. Watch cholesterol and blood lipids Have your blood tested for lipids and cholesterol at 32 years of age, then havethis test every 5 years. Have your cholesterol levels checked more often if: Your lipid or cholesterol levels are high. You are older than 32 years of age. You are at high risk for heart disease. What should I know about cancer screening? Depending on your health history and family history, you may need to have cancer screening at various ages. This may include screening for: Breast cancer. Cervical cancer. Colorectal cancer. Skin cancer. Lung cancer. What should I know about heart disease, diabetes, and high blood pressure? Blood pressure and heart  disease High blood pressure causes heart disease and increases the risk of stroke. This is more likely to develop in people who have high blood pressure readings, are of African descent, or are overweight. Have your blood pressure checked: Every 3-5 years if you are 18-39 years of age. Every year if you are 40 years old or older. Diabetes Have regular diabetes screenings. This checks your fasting blood sugar level. Have the screening done: Once every three years after age 40 if you are at a normal weight and have a low risk for diabetes. More often and at a younger age if you are overweight or have a high risk for diabetes. What should I know about preventing infection? Hepatitis B If you have a higher risk for hepatitis B, you should be screened for this virus. Talk with your health care provider to find out if you are at risk forhepatitis B infection. Hepatitis C Testing is recommended for: Everyone born from 1945 through 1965. Anyone with known risk factors for hepatitis C. Sexually transmitted infections (STIs) Get screened for STIs, including gonorrhea and chlamydia, if: You are sexually active and are younger than 32 years of age. You are older than 32 years of age and your health care provider tells you that you are at risk for this type of infection. Your sexual activity has changed since you were last screened, and you are at increased risk for chlamydia or gonorrhea. Ask your health care provider if you are at risk. Ask your health care provider about whether you are at high risk for HIV. Your health care provider may recommend a prescription medicine to help   prevent HIV infection. If you choose to take medicine to prevent HIV, you should first get tested for HIV. You should then be tested every 3 months for as long as you are taking the medicine. Pregnancy If you are about to stop having your period (premenopausal) and you may become pregnant, seek counseling before you get  pregnant. Take 400 to 800 micrograms (mcg) of folic acid every day if you become pregnant. Ask for birth control (contraception) if you want to prevent pregnancy. Osteoporosis and menopause Osteoporosis is a disease in which the bones lose minerals and strength with aging. This can result in bone fractures. If you are 65 years old or older, or if you are at risk for osteoporosis and fractures, ask your health care provider if you should: Be screened for bone loss. Take a calcium or vitamin D supplement to lower your risk of fractures. Be given hormone replacement therapy (HRT) to treat symptoms of menopause. Follow these instructions at home: Lifestyle Do not use any products that contain nicotine or tobacco, such as cigarettes, e-cigarettes, and chewing tobacco. If you need help quitting, ask your health care provider. Do not use street drugs. Do not share needles. Ask your health care provider for help if you need support or information about quitting drugs. Alcohol use Do not drink alcohol if: Your health care provider tells you not to drink. You are pregnant, may be pregnant, or are planning to become pregnant. If you drink alcohol: Limit how much you use to 0-1 drink a day. Limit intake if you are breastfeeding. Be aware of how much alcohol is in your drink. In the [place] one drink equals one 12 oz bottle of beer (355 mL), one 5 oz glass of wine (148 mL), or one 1 oz glass of hard liquor (44 mL). General instructions Schedule regular health, dental, and eye exams. Stay current with your vaccines. Tell your health care provider if: You often feel depressed. You have ever been abused or do not feel safe at home. Summary Adopting a healthy lifestyle and getting preventive care are important in promoting health and wellness. Follow your health care provider's instructions about healthy diet, exercising, and getting tested or screened for diseases. Follow your health care provider's  instructions on monitoring your cholesterol and blood pressure. This information is not intended to replace advice given to you by your health care provider. Make sure you discuss any questions you have with your healthcare provider. Document Revised: 01/01/2018 Document Reviewed: 01/01/2018 Elsevier Patient Education  2022 Elsevier Inc.  

## 2020-08-05 NOTE — Progress Notes (Signed)
Glenda Robles is a 30 y.o. female  Chief Complaint  Patient presents with   Annual Exam    CPE. No breast or pap exam.     HPI: Glenda Robles is a 30 y.o. female patient seen today for annual CPE, labs. She is not fasting for labs.   Specialists: Derm - Dr. Vedia Coffer; spine surgeon - Dr. Noel Gerold   Last PAP: UTD - follows with GYN   Diet/Exercise: tries to be healthy but does overeats due to stress, does not eat pork; tries to be active Dental: UTD Vision: no vision issues and no glasses or contacts   Med refills needed today? N/a  Tdap in 2015  Past Medical History:  Diagnosis Date   HNP (herniated nucleus pulposus), lumbar    L5-S1   Medical history non-contributory     Past Surgical History:  Procedure Laterality Date   LUMBAR FUSION  04/22/2018   LUMBAR LAMINECTOMY/DECOMPRESSION MICRODISCECTOMY N/A 10/03/2017   Procedure: Microlumbar decompression Lumbar five-Sacral one ;  Surgeon: Jene Every, MD;  Location: [facility] OR;  Service: Orthopedics;  Laterality: N/A;    Social History   Socioeconomic History   Marital status: Married    Spouse name: Not on file   Number of children: Not on file   Years of education: Not on file   Highest education level: Master's degree (e.g., MA, MS, MEng, MEd, MSW, MBA)  Occupational History   Not on file  Tobacco Use   Smoking status: Never   Smokeless tobacco: Never  Vaping Use   Vaping Use: Never used  Substance and Sexual Activity   Alcohol use: No   Drug use: No   Sexual activity: Yes  Other Topics Concern   Not on file  Social History Narrative   Not on file   Social Determinants of Health   Financial Resource Strain: Not on file  Food Insecurity: Not on file  Transportation Needs: Not on file  Physical Activity: Not on file  Stress: Not on file  Social Connections: Not on file  Intimate Partner Violence: Not on file    Family History  Problem Relation Age of Onset   Diabetes Mother    Hypertension Mother       There is no immunization history for the selected administration types on file for this patient.  Outpatient Encounter Medications as of 08/05/2020  Medication Sig Note   [DISCONTINUED] Biotin 5000 MCG TABS Take 5,000 mcg by mouth daily. (Patient not taking: Reported on 08/05/2020) 09/27/2017: On hold due to upcoming procedure.   [DISCONTINUED] spironolactone (ALDACTONE) 25 MG tablet Take 25 mg by mouth daily. (Patient not taking: Reported on 08/05/2020)    [DISCONTINUED] terbinafine (LAMISIL) 250 MG tablet Take 1 tablet (250 mg total) by mouth daily. (Patient not taking: Reported on 08/05/2020)    No facility-administered encounter medications on file as of 08/05/2020.     ROS: Gen: no fever, chills  Skin: no rash, itching ENT: no ear pain, ear drainage, nasal congestion, rhinorrhea, sinus pressure, sore throat Eyes: no blurry vision, double vision Resp: no cough, wheeze,SOB CV: no CP, palpitations, LE edema,  GI: no heartburn, n/v/d/c, abd pain GU: no dysuria, urgency, frequency, hematuria MSK: chronic low back pain Neuro: no dizziness, headache, weakness Psych: no depression, anxiety, insomnia   Allergies  Allergen Reactions   Pork-Derived Products Other (See Comments)    Pt is Muslim    BP 112/80 (BP Location: Left Arm, Patient Position: Sitting, Cuff Size: Normal)   Pulse  93   Temp 98.1 F (36.7 C) (Temporal)   Ht 5\' 2"  (1.575 m)   Wt 144 lb 6.4 oz (65.5 kg)   LMP 07/17/2020 (Exact Date)   SpO2 99%   BMI 26.41 kg/m  Wt Readings from Last 3 Encounters:  08/05/20 144 lb 6.4 oz (65.5 kg)  05/26/19 127 lb 6.4 oz (57.8 kg)  05/01/19 131 lb (59.4 kg)   Temp Readings from Last 3 Encounters:  08/05/20 98.1 F (36.7 C) (Temporal)  05/26/19 (!) 97.4 F (36.3 C)  05/01/19 97.8 F (36.6 C) (Temporal)   BP Readings from Last 3 Encounters:  08/05/20 112/80  05/26/19 120/77  05/01/19 118/80   Pulse Readings from Last 3 Encounters:  08/05/20 93  05/26/19 93  05/01/19  96     Physical Exam Constitutional:      General: She is not in acute distress.    Appearance: She is well-developed.  HENT:     Head: Normocephalic and atraumatic.     Right Ear: Tympanic membrane and ear canal normal.     Left Ear: Tympanic membrane and ear canal normal.     Nose: Nose normal.     Mouth/Throat:     Mouth: Mucous membranes are moist.     Pharynx: Oropharynx is clear.  Eyes:     Conjunctiva/sclera: Conjunctivae normal.  Neck:     Thyroid: No thyromegaly.  Cardiovascular:     Rate and Rhythm: Normal rate and regular rhythm.     Heart sounds: Normal heart sounds. No murmur heard. Pulmonary:     Effort: Pulmonary effort is normal. No respiratory distress.     Breath sounds: Normal breath sounds. No wheezing or rhonchi.  Abdominal:     General: Bowel sounds are normal. There is no distension.     Palpations: Abdomen is soft. There is no mass.     Tenderness: There is no abdominal tenderness.  Musculoskeletal:     Cervical back: Neck supple.     Right lower leg: No edema.     Left lower leg: No edema.  Lymphadenopathy:     Cervical: No cervical adenopathy.  Skin:    General: Skin is warm and dry.  Neurological:     Mental Status: She is alert and oriented to person, place, and time.     Motor: No abnormal muscle tone.     Coordination: Coordination normal.  Psychiatric:        Mood and Affect: Mood normal.        Behavior: Behavior normal.     A/P:  1. Annual physical exam - discussed importance of regular CV exercise, healthy diet, adequate sleep - UTD on PAP - UTD on dental exam - UTD on immunizations - CBC; Future - Basic metabolic panel; Future - AST; Future - ALT; Future - next CPE in 1 year  2. Screening for lipid disorders - Lipid panel; Future  3. Screening for thyroid disorder - TSH; Future    This visit occurred during the SARS-CoV-2 public health emergency.  Safety protocols were in place, including screening questions prior  to the visit, additional usage of staff PPE, and extensive cleaning of exam room while observing appropriate contact time as indicated for disinfecting solutions.

## 2020-08-10 ENCOUNTER — Other Ambulatory Visit: Payer: Self-pay

## 2020-08-10 ENCOUNTER — Other Ambulatory Visit (INDEPENDENT_AMBULATORY_CARE_PROVIDER_SITE_OTHER): Payer: Managed Care, Other (non HMO)

## 2020-08-10 DIAGNOSIS — Z0000 Encounter for general adult medical examination without abnormal findings: Secondary | ICD-10-CM

## 2020-08-10 DIAGNOSIS — Z1329 Encounter for screening for other suspected endocrine disorder: Secondary | ICD-10-CM | POA: Diagnosis not present

## 2020-08-10 DIAGNOSIS — Z13220 Encounter for screening for lipoid disorders: Secondary | ICD-10-CM | POA: Diagnosis not present

## 2020-08-10 LAB — TSH: TSH: 1.13 u[IU]/mL (ref 0.35–5.50)

## 2020-08-10 LAB — BASIC METABOLIC PANEL
BUN: 13 mg/dL (ref 6–23)
CO2: 21 mEq/L (ref 19–32)
Calcium: 9.6 mg/dL (ref 8.4–10.5)
Chloride: 103 mEq/L (ref 96–112)
Creatinine, Ser: 0.62 mg/dL (ref 0.40–1.20)
GFR: 118.46 mL/min (ref >60.00)
Glucose, Bld: 80 mg/dL (ref 70–99)
Potassium: 4.3 mEq/L (ref 3.5–5.1)
Sodium: 135 mEq/L (ref 135–145)

## 2020-08-10 LAB — LIPID PANEL
Cholesterol: 196 mg/dL (ref 0–200)
HDL: 52.8 mg/dL (ref >39.00)
LDL Cholesterol: 130 mg/dL — ABNORMAL HIGH (ref 0–99)
NonHDL: 143.3
Total CHOL/HDL Ratio: 4
Triglycerides: 66 mg/dL (ref 0.0–149.0)
VLDL: 13.2 mg/dL (ref 0.0–40.0)

## 2020-08-10 LAB — ALT: ALT: 20 U/L (ref 0–35)

## 2020-08-10 LAB — CBC
HCT: 42.3 % (ref 36.0–46.0)
Hemoglobin: 14.5 g/dL (ref 12.0–15.0)
MCHC: 34.3 g/dL (ref 30.0–36.0)
MCV: 90.9 fl (ref 78.0–100.0)
Platelets: 209 10*3/uL (ref 150.0–400.0)
RBC: 4.66 Mil/uL (ref 3.87–5.11)
RDW: 12.9 % (ref 11.5–15.5)
WBC: 6.8 10*3/uL (ref 4.0–10.5)

## 2020-08-10 LAB — AST: AST: 19 U/L (ref 0–37)

## 2021-07-19 ENCOUNTER — Encounter: Payer: Self-pay | Admitting: Family Medicine

## 2021-07-21 ENCOUNTER — Encounter: Payer: Self-pay | Admitting: Family Medicine

## 2021-07-21 ENCOUNTER — Ambulatory Visit (INDEPENDENT_AMBULATORY_CARE_PROVIDER_SITE_OTHER): Payer: 59 | Admitting: Family Medicine

## 2021-07-21 VITALS — BP 120/80 | HR 95 | Temp 97.6°F | Ht 62.0 in | Wt 145.6 lb

## 2021-07-21 DIAGNOSIS — G4762 Sleep related leg cramps: Secondary | ICD-10-CM | POA: Diagnosis not present

## 2021-07-21 DIAGNOSIS — Z8739 Personal history of other diseases of the musculoskeletal system and connective tissue: Secondary | ICD-10-CM | POA: Insufficient documentation

## 2021-07-21 NOTE — Progress Notes (Signed)
[facility] [address] [place] [address] Dept: [phone] Dept Fax: [phone]  Transfer of Care Office Visit  Subjective:    Patient ID: Glenda Robles, female    DOB: [birth year], 31 y.o..   MRN: [id number]  Chief Complaint  Patient presents with   Establish Care    [facility]- establish care.   C/o lost voice 2 days ago.        History of Present Illness:  Patient is in today to establish care. Glenda Robles was born in [place], [place]. She and her husband immigrated tot he [place] in 2015. They have been married for 9 years and have a 17 1/2 year-old son. Glenda Robles has a degree in applied mathematics from a university in [place]. She attended [employer] and received her masters in supply chain management. She now works for [employer]. She denies any tobacco, alcohol, or drug use.  Glenda Robles has a history of a prior herniated disc at L5-S1. She had a microlumbar decompression in 2019. As she had recurrent pain issues, she underwent a spinal fusion in 2020. Now she feels like her back is doing quite well. She is active, though does not get to the gym as often as she would like.   Glenda Robles does note she has issues with periodic nocturnal leg cramps. This has been occurring more frequently in the past month. She usually stretches the calf out and it eventually resolves. She feels she does a good job staying hydrated.  Past Medical History: Patient Active Problem List   Diagnosis Date Noted   History of herniated intervertebral disc 07/21/2021   Past Surgical History:  Procedure Laterality Date   LUMBAR FUSION  04/22/2018   LUMBAR LAMINECTOMY/DECOMPRESSION MICRODISCECTOMY N/A 10/03/2017   Procedure: Microlumbar decompression Lumbar five-Sacral one ;  Surgeon: Jene Every, MD;  Location: [facility] OR;  Service: Orthopedics;  Laterality: N/A;   Family History  Problem Relation Age of Onset   Heart disease Mother    Diabetes Mother     Hypertension Mother    Hypertension Father    Heart disease Maternal Uncle    Heart disease Maternal Grandmother    Cancer Paternal Grandfather        Liver   Outpatient Medications Prior to Visit  Medication Sig Dispense Refill   Multiple Vitamin (MULTIVITAMINS PO) Take by mouth.     paragard intrauterine copper IUD IUD 1 device [facility]     No facility-administered medications prior to visit.   Allergies  Allergen Reactions   Pork-Derived Products Other (See Comments)    Pt is Muslim    Objective:   Today's Vitals   07/21/21 1606  BP: 120/80  Pulse: 95  Temp: 97.6 F (36.4 C)  TempSrc: Temporal  SpO2: 99%  Weight: 145 lb 9.6 oz (66 kg)  Height: 5\' 2"  (1.575 m)   Body mass index is 26.63 kg/m.   General: Well developed, well nourished. No acute distress. Psych: Alert and oriented. Normal mood and affect.  Health Maintenance Due  Topic Date Due   Hepatitis C Screening  Never done   PAP SMEAR-Modifier  Never done   COVID-19 Vaccine (3 - Pfizer series) 08/26/2019     Assessment & Plan:   1. Nocturnal leg cramps I recommend we check some screening labs to assess for underlying causes. She should consider taking a clacium and a magnesium supplement to see if this will help resolve the issues.  -  Comprehensive metabolic panel - Magnesium   Return in about 1 year (around 07/22/2022).   Loyola Mast, MD

## 2021-07-22 LAB — COMPREHENSIVE METABOLIC PANEL
ALT: 16 IU/L (ref 0–32)
AST: 15 IU/L (ref 0–40)
Albumin/Globulin Ratio: 1.5 (ref 1.2–2.2)
Albumin: 4.9 g/dL — ABNORMAL HIGH (ref 3.8–4.8)
Alkaline Phosphatase: 84 IU/L (ref 44–121)
BUN/Creatinine Ratio: 11 (ref 9–23)
BUN: 7 mg/dL (ref 6–20)
Bilirubin Total: <0.2 mg/dL (ref 0.0–1.2)
CO2: 22 mmol/L (ref 20–29)
Calcium: 10 mg/dL (ref 8.7–10.2)
Chloride: 101 mmol/L (ref 96–106)
Creatinine, Ser: 0.61 mg/dL (ref 0.57–1.00)
Globulin, Total: 3.2 g/dL (ref 1.5–4.5)
Glucose: 81 mg/dL (ref 70–99)
Potassium: 4.1 mmol/L (ref 3.5–5.2)
Sodium: 138 mmol/L (ref 134–144)
Total Protein: 8.1 g/dL (ref 6.0–8.5)
eGFR: 122 mL/min/{1.73_m2} (ref >59)

## 2021-07-22 LAB — MAGNESIUM: Magnesium: 2 mg/dL (ref 1.6–2.3)

## 2022-07-24 ENCOUNTER — Encounter: Payer: Self-pay | Admitting: Family Medicine

## 2022-08-08 ENCOUNTER — Encounter: Payer: Self-pay | Admitting: Family Medicine

## 2023-03-04 ENCOUNTER — Ambulatory Visit: Payer: 59 | Admitting: Family Medicine

## 2023-03-04 ENCOUNTER — Ambulatory Visit: Payer: Self-pay | Admitting: Family Medicine

## 2023-03-04 ENCOUNTER — Encounter: Payer: Self-pay | Admitting: Family Medicine

## 2023-03-04 VITALS — BP 126/80 | HR 58 | Temp 98.4°F | Ht 62.0 in | Wt 160.2 lb

## 2023-03-04 DIAGNOSIS — H8110 Benign paroxysmal vertigo, unspecified ear: Secondary | ICD-10-CM | POA: Diagnosis not present

## 2023-03-04 MED ORDER — MECLIZINE HCL 25 MG PO TABS: 25.0000 mg | ORAL_TABLET | Freq: Three times a day (TID) | ORAL | 0 refills | Status: AC | PRN

## 2023-03-04 NOTE — Telephone Encounter (Signed)
 Copied from CRM [id number]. Topic: Clinical - Red Word Triage >> Mar 04, 2023  9:00 AM Adonis Hoot wrote: Red Word that prompted transfer to Nurse Triage: dizziness x 2 days,headache   Chief Complaint: Dizziness  Symptoms: dizziness Frequency: x 2 days Pertinent Negatives: Patient denies ear aches, sore throat, diarrhea, difficulty breathing, chest pain, fever, falls, injuries, vision problems, weakness, numbness Disposition: [] ED /[] Urgent Care (no appt availability in office) / [x] Appointment(In office/virtual)/ []  [facility]/ [] Home Care/ [] Refused Recommended Disposition /[] [facility]/ []  Follow-up with PCP Additional Notes: Patient called and states that she had some coffee 2 days ago and she usually doesn't drink coffee.  She said that the next day she started having some dizziness where it would feel like the room was spinning and she would experience some nausea.  She states she also has a headache but it was manageable.  She states that the dizziness feels like the room is spinning and she has had it happen before in the past but usually it would go away.  She states that this time it is lasting longer.  Patient denies ear aches, sore throat, diarrhea, difficulty breathing, chest pain, fever, falls, injuries, vision problems, weakness, numbness. She also denies any recent new medications or medication changes. Appointment was made for today 03/04/2023 with patient's PCP Dr Remo Carls at 1:20pm.  Glenda Robles is advised that if anything changes or worsens to go to the emergency room or call 911.  Patient verbalized understanding.  Reason for Disposition  [1] MODERATE dizziness (e.g., vertigo; feels very unsteady, interferes with normal activities) AND [2] has NOT been evaluated by doctor (or NP/PA) for this  Answer Assessment - Initial Assessment Questions 1. DESCRIPTION: "Describe your dizziness."     spinning 2. VERTIGO: "Do you feel like either you or the room is spinning  or tilting?"      spinning 3. LIGHTHEADED: "Do you feel lightheaded?" (e.g., somewhat faint, woozy, weak upon standing)     If she turns her head quickly 4. SEVERITY: "How bad is it?"  "Can you walk?"   - MILD: Feels slightly dizzy and unsteady, but is walking normally.   - MODERATE: Feels unsteady when walking, but not falling; interferes with normal activities (e.g., school, work).   - SEVERE: Unable to walk without falling, or requires assistance to walk without falling.     yes 5. ONSET:  "When did the dizziness begin?"     2 days ago 6. AGGRAVATING FACTORS: "Does anything make it worse?" (e.g., standing, change in head position)     Staring at computer screen makes it worse 7. CAUSE: "What do you think is causing the dizziness?"     unknown 8. RECURRENT SYMPTOM: "Have you had dizziness before?" If Yes, ask: "When was the last time?" "What happened that time?"     Yeah in the past but it went away 9. OTHER SYMPTOMS: "Do you have any other symptoms?" (e.g., headache, weakness, numbness, vomiting, earache)     Headache, nausea 10. PREGNANCY: "Is there any chance you are pregnant?" "When was your last menstrual period?"       No--on period now and she has an IUD  Protocols used: Dizziness - Vertigo-A-AH

## 2023-03-04 NOTE — Assessment & Plan Note (Signed)
 Patient is having episodic, triggered vertigo. However, her Dix-Halpike maneuver was normal today. I still suspect this was BPPV that may now be resolving. I will prescribe some meclizine  for as needed use.

## 2023-03-04 NOTE — Progress Notes (Signed)
  [facility] [address] [place] [address] Dept: [phone] Dept Fax: [phone]  Office Visit  Subjective:    Patient ID: Glenda Robles, female    DOB: 1990-06-07, 33 y.o..   MRN: [id number]  Chief Complaint  Patient presents with   Dizziness    C/o having some dizziness x 3 days off/on with bending over.   Also having nausea and Ha's .  No OTC meds taken    History of Present Illness:  Patient is in today complaining of a 3-day history of vertigo associated with bending over or rotating her head. This passes over a few minutes. She has some mild associated nausea. She has not had any sudden hearing loss or ringing in her ears.  Past Medical History: Patient Active Problem List   Diagnosis Date Noted   History of herniated intervertebral disc 07/21/2021   Past Surgical History:  Procedure Laterality Date   LUMBAR FUSION  04/22/2018   LUMBAR LAMINECTOMY/DECOMPRESSION MICRODISCECTOMY N/A 10/03/2017   Procedure: Microlumbar decompression Lumbar five-Sacral one ;  Surgeon: Orvan Blanch, MD;  Location: [facility] OR;  Service: Orthopedics;  Laterality: N/A;   Family History  Problem Relation Age of Onset   Heart disease Mother    Diabetes Mother    Hypertension Mother    Hypertension Father    Heart disease Maternal Uncle    Heart disease Maternal Grandmother    Cancer Paternal Grandfather        Liver   Outpatient Medications Prior to Visit  Medication Sig Dispense Refill   Multiple Vitamin (MULTIVITAMINS PO) Take by mouth.     paragard intrauterine copper IUD IUD 1 device [facility]     No facility-administered medications prior to visit.   Allergies  Allergen Reactions   Pork-Derived Products Other (See Comments)    Pt is Muslim     Objective:   Today's Vitals   03/04/23 1323  BP: 126/80  Pulse: (!) 58  Temp: 98.4 F (36.9 C)  TempSrc: Temporal  SpO2: 100%  Weight: 160 lb 3.2 oz (72.7 kg)   Height: 5\' 2"  (1.575 m)   Body mass index is 29.3 kg/m.   General: Well developed, well nourished. No acute distress. HEENT: Normocephalic, non-traumatic. PERRL, EOMI. Conjunctiva clear. External ears normal. EAC and TMs   normal bilaterally.  Neuro: CN II-XII intact. Dix-Halpike maneuver does not elicit any nystagmus or sensation of vertigo. Psych: Alert and oriented. Normal mood and affect.  Health Maintenance Due  Topic Date Due   Hepatitis C Screening  Never done   Cervical Cancer Screening (HPV/Pap Cotest)  Never done     Assessment & Plan:   Problem List Items Addressed This Visit       Nervous and Auditory   Benign paroxysmal positional vertigo - Primary   Patient is having episodic, triggered vertigo. However, her Dix-Halpike maneuver was normal today. I still suspect this was BPPV that may now be resolving. I will prescribe some meclizine  for as needed use.      Relevant Medications   meclizine  (ANTIVERT ) 25 MG tablet    Return for Follow-up as scheduled.   Graig Lawyer, MD

## 2023-03-04 NOTE — Patient Instructions (Signed)
 Benign Positional Vertigo Vertigo is the feeling that you or your surroundings are moving when they are not. Benign positional vertigo is the most common form of vertigo. This is usually a harmless condition (benign). This condition is positional. This means that symptoms are triggered by certain movements and positions. This condition can be dangerous if it occurs while you are doing something that could cause harm to yourself or others. This includes activities such as driving or operating machinery. What are the causes? The inner ear has fluid-filled canals that help your brain sense movement and balance. When the fluid moves, the brain receives messages about your body's position. With benign positional vertigo, calcium crystals in the inner ear break free and disturb the inner ear area. This causes your brain to receive confusing messages about your body's position. What increases the risk? You are more likely to develop this condition if: You are a woman. You are 35 years of age or older. You have recently had a head injury. You have an inner ear disease. What are the signs or symptoms? Symptoms of this condition usually happen when you move your head or your eyes in different directions. Symptoms may start suddenly and usually last for less than a minute. They include: Loss of balance and falling. Feeling like you are spinning or moving. Feeling like your surroundings are spinning or moving. Nausea and vomiting. Blurred vision. Dizziness. Involuntary eye movement (nystagmus). Symptoms can be mild and cause only minor problems, or they can be severe and interfere with daily life. Episodes of benign positional vertigo may return (recur) over time. Symptoms may also improve over time. How is this diagnosed? This condition may be diagnosed based on: Your medical history. A physical exam of the head, neck, and ears. Positional tests to check for or stimulate vertigo. You may be asked to  turn your head and change positions, such as going from sitting to lying down. A health care provider will watch for symptoms of vertigo. You may be referred to a health care provider who specializes in ear, nose, and throat problems (ENT or otolaryngologist) or a provider who specializes in disorders of the nervous system (neurologist). How is this treated?  This condition may be treated in a session in which your health care provider moves your head in specific positions to help the displaced crystals in your inner ear move. Treatment for this condition may take several sessions. Surgery may be needed in severe cases, but this is rare. In some cases, benign positional vertigo may resolve on its own in 2-4 weeks. Follow these instructions at home: Safety Move slowly. Avoid sudden body or head movements or certain positions, as told by your health care provider. Avoid driving or operating machinery until your health care provider says it is safe. Avoid doing any tasks that would be dangerous to you or others if vertigo occurs. If you have trouble walking or keeping your balance, try using a cane for stability. If you feel dizzy or unstable, sit down right away. Return to your normal activities as told by your health care provider. Ask your health care provider what activities are safe for you. General instructions Take over-the-counter and prescription medicines only as told by your health care provider. Drink enough fluid to keep your urine pale yellow. Keep all follow-up visits. This is important. Contact a health care provider if: You have a fever. Your condition gets worse or you develop new symptoms. Your family or friends notice any behavioral changes.  You have nausea or vomiting that gets worse. You have numbness or a prickling and tingling sensation. Get help right away if you: Have difficulty speaking or moving. Are always dizzy or faint. Develop severe headaches. Have weakness in  your legs or arms. Have changes in your hearing or vision. Develop a stiff neck. Develop sensitivity to light. These symptoms may represent a serious problem that is an emergency. Do not wait to see if the symptoms will go away. Get medical help right away. Call your local emergency services (911 in the [place] Do not drive yourself to the hospital. Summary Vertigo is the feeling that you or your surroundings are moving when they are not. Benign positional vertigo is the most common form of vertigo. This condition is caused by calcium crystals in the inner ear that become displaced. This causes a disturbance in an area of the inner ear that helps your brain sense movement and balance. Symptoms include loss of balance and falling, feeling that you or your surroundings are moving, nausea and vomiting, and blurred vision. This condition can be diagnosed based on symptoms, a physical exam, and positional tests. Follow safety instructions as told by your health care provider and keep all follow-up visits. This is important. This information is not intended to replace advice given to you by your health care provider. Make sure you discuss any questions you have with your health care provider. Document Revised: 07/30/2022 Document Reviewed: 07/30/2022 Elsevier Patient Education  2024 [employer].

## 2023-03-04 NOTE — Telephone Encounter (Signed)
 Noted. Dm/cma

## 2023-03-05 ENCOUNTER — Encounter: Payer: Self-pay | Admitting: Family Medicine

## 2023-03-06 ENCOUNTER — Encounter: Payer: Self-pay | Admitting: Family Medicine

## 2023-03-06 ENCOUNTER — Ambulatory Visit (INDEPENDENT_AMBULATORY_CARE_PROVIDER_SITE_OTHER): Payer: 59 | Admitting: Family Medicine

## 2023-03-06 VITALS — BP 130/84 | HR 81 | Temp 98.2°F | Ht 62.0 in | Wt 160.2 lb

## 2023-03-06 DIAGNOSIS — L680 Hirsutism: Secondary | ICD-10-CM | POA: Diagnosis not present

## 2023-03-06 DIAGNOSIS — H8110 Benign paroxysmal vertigo, unspecified ear: Secondary | ICD-10-CM | POA: Diagnosis not present

## 2023-03-06 DIAGNOSIS — L700 Acne vulgaris: Secondary | ICD-10-CM | POA: Insufficient documentation

## 2023-03-06 DIAGNOSIS — Z0000 Encounter for general adult medical examination without abnormal findings: Secondary | ICD-10-CM | POA: Diagnosis not present

## 2023-03-06 DIAGNOSIS — E281 Androgen excess: Secondary | ICD-10-CM

## 2023-03-06 NOTE — Assessment & Plan Note (Signed)
Mild papular acne. I light of acne and hirsutism, I will check for androgen excess. Would consider spironolactone if this is elevated.

## 2023-03-06 NOTE — Progress Notes (Signed)
[facility] [address] [place] [address] Dept: [phone] Dept Fax: [phone]  Annual Physical Visit  Subjective:    Patient ID: Glenda Robles, female    DOB: Jan 17, 1991, 33 y.o..   MRN: [id number]  Chief Complaint  Patient presents with   Annual Exam    CPE/labs.   No concerns.    History of Present Illness:  Patient is in today for an annual physical/preventative visit.  Review of Systems  Constitutional:  Negative for chills, diaphoresis, fever, malaise/fatigue and weight loss.  HENT:  Negative for congestion, ear pain, hearing loss, sinus pain, sore throat and tinnitus.   Eyes:  Negative for blurred vision, pain, discharge and redness.  Respiratory:  Negative for cough, shortness of breath and wheezing.   Cardiovascular:  Negative for chest pain and palpitations.  Gastrointestinal:  Positive for constipation. Negative for abdominal pain, diarrhea, heartburn, nausea and vomiting.       Mild constipation. She uses an OTC GI supplement, which helps.  Genitourinary:        Normal menses.  Musculoskeletal:  Negative for back pain, joint pain and myalgias.  Skin:  Positive for rash. Negative for itching.       Has a history of some acne on the lateral face and chin. Also notes some hair growth on the chin.  Neurological:  Positive for dizziness.       Recent positional vertigo. She has also noted that the light from her computer screen seems to set this off. Her meclizine has helped.  Psychiatric/Behavioral:  Negative for depression. The patient is not nervous/anxious.    Past Medical History: Patient Active Problem List   Diagnosis Date Noted   Benign paroxysmal positional vertigo 03/04/2023   History of herniated intervertebral disc 07/21/2021   Past Surgical History:  Procedure Laterality Date   LUMBAR FUSION  04/22/2018   LUMBAR LAMINECTOMY/DECOMPRESSION MICRODISCECTOMY N/A 10/03/2017   Procedure:  Microlumbar decompression Lumbar five-Sacral one ;  Surgeon: Jene Every, MD;  Location: [facility] OR;  Service: Orthopedics;  Laterality: N/A;   Family History  Problem Relation Age of Onset   Heart disease Mother    Diabetes Mother    Hypertension Mother    Hypertension Father    Heart disease Maternal Uncle    Heart disease Maternal Grandmother    Cancer Paternal Grandfather        Liver   Outpatient Medications Prior to Visit  Medication Sig Dispense Refill   meclizine (ANTIVERT) 25 MG tablet Take 1 tablet (25 mg total) by mouth 3 (three) times daily as needed. 30 tablet 0   Multiple Vitamin (MULTIVITAMINS PO) Take by mouth.     paragard intrauterine copper IUD IUD 1 device provided [facility]     No facility-administered medications prior to visit.   Allergies  Allergen Reactions   Pork-Derived Products Other (See Comments)    Pt is Muslim   Objective:   Today's Vitals   03/06/23 1517  BP: 130/84  Pulse: 81  Temp: 98.2 F (36.8 C)  TempSrc: Temporal  SpO2: 99%  Weight: 160 lb 3.2 oz (72.7 kg)  Height: 5\' 2"  (1.575 m)   Body mass index is 29.3 kg/m.   General: Well developed, well nourished. No acute distress. HEENT: Normocephalic, non-traumatic. PERRL, EOMI. Conjunctiva clear. External ears normal. EAC and TMs normal bilaterally.   Nose clear without congestion or rhinorrhea. Mucous membranes moist. Oropharynx clear. Good dentition. Neck: Supple. No lymphadenopathy. No thyromegaly.  Lungs: Clear to auscultation bilaterally. No wheezing, rales or rhonchi. CV: RRR without murmurs or rubs. Pulses 2+ bilaterally. Abdomen: Soft, non-tender. Bowel sounds positive, normal pitch and frequency. No hepatosplenomegaly. No rebound or guarding. Extremities: Full ROM. No joint swelling or tenderness. No edema noted. Skin: Warm and dry. There are scattered papular lesions on the lateral face, chin, and upper neck. There are coarse black hairs   growing on the chin. Psych:  Alert and oriented. Normal mood and affect.  Health Maintenance Due  Topic Date Due   Hepatitis C Screening  Never done     Assessment & Plan:   Problem List Items Addressed This Visit       Nervous and Auditory   Benign paroxysmal positional vertigo   Patient is having episodic, triggered vertigo. Her Dix-Halpike maneuver was normal earlier this week. I still suspect this was BPPV that may now be resolving. Continue meclizine.        Musculoskeletal and Integument   Acne vulgaris   Mild papular acne. I light of acne and hirsutism, I will check for androgen excess. Would consider spironolactone if this is elevated.      Hirsutism   Mild papular acne. I light of acne and hirsutism, I will check for androgen excess. Would consider spironolactone if this is elevated.      Relevant Orders   TSH   Testosterone   Basic metabolic panel   Hemoglobin A1c   Other Visit Diagnoses       Annual physical exam    -  Primary   Overall good health. I encourage regular exercise. Discussed recommended screenings and immunizations.       Return in about 1 year (around 03/05/2024) for Annual preventative care.   Loyola Mast, MD

## 2023-03-06 NOTE — Assessment & Plan Note (Signed)
Patient is having episodic, triggered vertigo. Her Dix-Halpike maneuver was normal earlier this week. I still suspect this was BPPV that may now be resolving. Continue meclizine.

## 2023-03-07 ENCOUNTER — Encounter: Payer: Self-pay | Admitting: Family Medicine

## 2023-03-07 LAB — BASIC METABOLIC PANEL
BUN: 9 mg/dL (ref 6–23)
CO2: 28 meq/L (ref 19–32)
Calcium: 9.6 mg/dL (ref 8.4–10.5)
Chloride: 99 meq/L (ref 96–112)
Creatinine, Ser: 0.62 mg/dL (ref 0.40–1.20)
GFR: 116.34 mL/min (ref >60.00)
Glucose, Bld: 76 mg/dL (ref 70–99)
Potassium: 3.9 meq/L (ref 3.5–5.1)
Sodium: 138 meq/L (ref 135–145)

## 2023-03-07 LAB — TESTOSTERONE: Testosterone: 45.6 ng/dL — ABNORMAL HIGH (ref 15.00–40.00)

## 2023-03-07 LAB — HEMOGLOBIN A1C: Hgb A1c MFr Bld: 5.3 % (ref 4.6–6.5)

## 2023-03-07 LAB — TSH: TSH: 0.82 u[IU]/mL (ref 0.35–5.50)

## 2023-03-07 MED ORDER — SPIRONOLACTONE 50 MG PO TABS: 50.0000 mg | ORAL_TABLET | Freq: Two times a day (BID) | ORAL | 3 refills | Status: AC

## 2023-03-07 NOTE — Addendum Note (Signed)
Addended by: Loyola Mast on: 03/07/2023 12:01 PM   Modules accepted: Orders

## 2023-07-01 ENCOUNTER — Ambulatory Visit: Admitting: Family Medicine

## 2023-07-01 ENCOUNTER — Encounter: Payer: Self-pay | Admitting: Family Medicine

## 2023-07-01 VITALS — BP 116/76 | HR 100 | Temp 98.6°F | Ht 62.0 in | Wt 148.2 lb

## 2023-07-01 DIAGNOSIS — J020 Streptococcal pharyngitis: Secondary | ICD-10-CM | POA: Diagnosis not present

## 2023-07-01 LAB — POCT RAPID STREP A (OFFICE): Rapid Strep A Screen: NEGATIVE

## 2023-07-01 MED ORDER — PENICILLIN V POTASSIUM 500 MG PO TABS: 500.0000 mg | ORAL_TABLET | Freq: Three times a day (TID) | ORAL | 0 refills | Status: AC

## 2023-07-01 NOTE — Progress Notes (Signed)
 Established Patient Office Visit   Subjective:  Patient ID: Glenda Robles, female    DOB: 08-Aug-1990  Age: 33 y.o. MRN: [id number]  Chief Complaint  Patient presents with   Sore Throat    Sore throat x 2 days right side. Headache, fever chill.     Sore Throat  Associated symptoms include headaches. Pertinent negatives include no abdominal pain or vomiting.   Encounter Diagnoses  Name Primary?   Strep throat Yes   2-day history of sore throat with headache, elevated temperature and poor appetite.  Denies nausea or vomiting and cough.   Review of Systems  Constitutional: Negative.   HENT:  Positive for sore throat.   Eyes:  Negative for blurred vision, discharge and redness.  Respiratory: Negative.    Cardiovascular: Negative.   Gastrointestinal:  Negative for abdominal pain, nausea and vomiting.  Genitourinary: Negative.   Musculoskeletal: Negative.  Negative for myalgias.  Skin:  Negative for rash.  Neurological:  Positive for headaches. Negative for tingling, loss of consciousness and weakness.  Endo/Heme/Allergies:  Negative for polydipsia.     Current Outpatient Medications:    meclizine  (ANTIVERT ) 25 MG tablet, Take 1 tablet (25 mg total) by mouth 3 (three) times daily as needed., Disp: 30 tablet, Rfl: 0   Multiple Vitamin (MULTIVITAMINS PO), Take by mouth., Disp: , Rfl:    paragard intrauterine copper IUD IUD, 1 device provided by [facility], Disp: , Rfl:    penicillin v potassium (VEETID) 500 MG tablet, Take 1 tablet (500 mg total) by mouth 3 (three) times daily for 10 days., Disp: 30 tablet, Rfl: 0   spironolactone  (ALDACTONE ) 50 MG tablet, Take 1 tablet (50 mg total) by mouth 2 (two) times daily., Disp: 180 tablet, Rfl: 3   Objective:     BP 116/76 (Cuff Size: Normal)   Pulse 100   Temp 98.6 F (37 C) (Oral)   Ht 5\' 2"  (1.575 m)   Wt 148 lb 3.2 oz (67.2 kg)   SpO2 97%   BMI 27.11 kg/m    Physical Exam Constitutional:      General: She is not in  acute distress.    Appearance: Normal appearance. She is not ill-appearing, toxic-appearing or diaphoretic.  HENT:     Head: Normocephalic and atraumatic.     Right Ear: External ear normal.     Left Ear: External ear normal.     Mouth/Throat:     Mouth: Mucous membranes are moist.     Pharynx: Pharyngeal swelling and posterior oropharyngeal erythema present. No oropharyngeal exudate.  Eyes:     General: No scleral icterus.       Right eye: No discharge.        Left eye: No discharge.     Extraocular Movements: Extraocular movements intact.     Conjunctiva/sclera: Conjunctivae normal.     Pupils: Pupils are equal, round, and reactive to light.  Cardiovascular:     Rate and Rhythm: Normal rate and regular rhythm.  Pulmonary:     Effort: Pulmonary effort is normal. No respiratory distress.     Breath sounds: Normal breath sounds. No wheezing or rales.  Abdominal:     General: Bowel sounds are normal.  Musculoskeletal:     Cervical back: No rigidity or tenderness.  Lymphadenopathy:     Cervical: Cervical adenopathy present.  Skin:    General: Skin is warm and dry.  Neurological:     Mental Status: She is alert and oriented to person, place,  and time.  Psychiatric:        Mood and Affect: Mood normal.        Behavior: Behavior normal.      No results found for any visits on 07/01/23.    The ASCVD Risk score (Arnett DK, et al., 2019) failed to calculate for the following reasons:   The 2019 ASCVD risk score is only valid for ages 10 to 41    Assessment & Plan:   Strep throat -     POCT rapid strep A -     Penicillin V Potassium; Take 1 tablet (500 mg total) by mouth 3 (three) times daily for 10 days.  Dispense: 30 tablet; Refill: 0    Return in about 3 days (around 07/04/2023), or if symptoms worsen or fail to improve.  Positive for strep by criteria with a negative strep test.  Treat with penicillin 500 3 times daily for 10 days.  Home precautions given.  Quarantine  through tomorrow.  Tonna Frederic, MD

## 2024-02-27 ENCOUNTER — Other Ambulatory Visit: Payer: Self-pay

## 2024-02-27 ENCOUNTER — Emergency Department (HOSPITAL_COMMUNITY)
Admission: EM | Admit: 2024-02-27 | Discharge: 2024-02-28 | Disposition: A | Source: Home / Self Care | Attending: Emergency Medicine | Admitting: Emergency Medicine

## 2024-02-27 DIAGNOSIS — R8290 Unspecified abnormal findings in urine: Secondary | ICD-10-CM

## 2024-02-27 DIAGNOSIS — R0602 Shortness of breath: Secondary | ICD-10-CM

## 2024-02-27 LAB — COMPREHENSIVE METABOLIC PANEL WITH GFR
ALT: 27 U/L (ref 0–44)
AST: 19 U/L (ref 15–41)
Albumin: 4 g/dL (ref 3.5–5.0)
Alkaline Phosphatase: 61 U/L (ref 38–126)
Anion gap: 12 (ref 5–15)
BUN: 8 mg/dL (ref 6–20)
CO2: 21 mmol/L — ABNORMAL LOW (ref 22–32)
Calcium: 9.4 mg/dL (ref 8.9–10.3)
Chloride: 101 mmol/L (ref 98–111)
Creatinine, Ser: 0.44 mg/dL (ref 0.44–1.00)
GFR, Estimated: >60 mL/min
Glucose, Bld: 86 mg/dL (ref 70–99)
Potassium: 3.9 mmol/L (ref 3.5–5.1)
Sodium: 134 mmol/L — ABNORMAL LOW (ref 135–145)
Total Bilirubin: 0.2 mg/dL (ref 0.0–1.2)
Total Protein: 7.6 g/dL (ref 6.5–8.1)

## 2024-02-27 LAB — CBC WITH DIFFERENTIAL/PLATELET
Abs Immature Granulocytes: 0.06 10*3/uL (ref 0.00–0.07)
Basophils Absolute: 0 10*3/uL (ref 0.0–0.1)
Basophils Relative: 0 %
Eosinophils Absolute: 0.1 10*3/uL (ref 0.0–0.5)
Eosinophils Relative: 1 %
HCT: 38.1 % (ref 36.0–46.0)
Hemoglobin: 13 g/dL (ref 12.0–15.0)
Immature Granulocytes: 1 %
Lymphocytes Relative: 19 %
Lymphs Abs: 1.9 10*3/uL (ref 0.7–4.0)
MCH: 31.6 pg (ref 26.0–34.0)
MCHC: 34.1 g/dL (ref 30.0–36.0)
MCV: 92.7 fL (ref 80.0–100.0)
Monocytes Absolute: 0.7 10*3/uL (ref 0.1–1.0)
Monocytes Relative: 7 %
Neutro Abs: 7.3 10*3/uL (ref 1.7–7.7)
Neutrophils Relative %: 72 %
Platelets: 217 10*3/uL (ref 150–400)
RBC: 4.11 MIL/uL (ref 3.87–5.11)
RDW: 12.8 % (ref 11.5–15.5)
WBC: 10 10*3/uL (ref 4.0–10.5)
nRBC: 0 % (ref 0.0–0.2)

## 2024-02-27 LAB — URINALYSIS, ROUTINE W REFLEX MICROSCOPIC
Bilirubin Urine: NEGATIVE
Glucose, UA: NEGATIVE mg/dL
Hgb urine dipstick: NEGATIVE
Ketones, ur: NEGATIVE mg/dL
Nitrite: NEGATIVE
Protein, ur: NEGATIVE mg/dL
Specific Gravity, Urine: 1.012 (ref 1.005–1.030)
pH: 6 (ref 5.0–8.0)

## 2024-02-27 LAB — TROPONIN T, HIGH SENSITIVITY
Troponin T High Sensitivity: <6 ng/L (ref 0–19)
Troponin T High Sensitivity: <6 ng/L (ref 0–19)

## 2024-02-27 LAB — PRO BRAIN NATRIURETIC PEPTIDE: Pro Brain Natriuretic Peptide: <50 pg/mL

## 2024-02-27 NOTE — ED Provider Triage Note (Signed)
 Emergency Medicine Provider Triage Evaluation Note  Glenda Robles , a 34 y.o. female  was evaluated in triage.  Pt complains of SHOB, [redacted] weeks pregnant G2P1 with routine prenatal care with [facility]. States progressive onset of SHOB over the past few weeks. Today climbed a light of stairs that she normally climbs without difficulty and felt notably [facility] for the next 10 minutes. No SHOB since that single event. No lower ext swelling, no significant weight changes, no recent extended travel. Also notes intermittent numbness in her left forearm/hand that started at night but now occurs during the day with certain positions resting her arm. This evening thought she may have had some blurry vision but unsure. No abdominal pain/bleeding/leaking fluids. No complications with first pregnancy.   Review of Systems  Positive:  Negative:   Physical Exam  BP (!) 140/80   Pulse (!) 106   Resp 18   Ht 5' 2 (1.575 m)   Wt 77.1 kg   SpO2 100%   BMI 31.09 kg/m  Gen:   Awake, no distress   Resp:  Normal effort  MSK:   Moves extremities without difficulty  Other:  No current sensory deficits.   Medical Decision Making  Medically screening exam initiated at 6:37 PM.  Appropriate orders placed.  Selyna Klahn was informed that the remainder of the evaluation will be completed by another provider, this initial triage assessment does not replace that evaluation, and the importance of remaining in the ED until their evaluation is complete.     Beverley Leita LABOR, PA-C 02/27/24 1840

## 2024-02-27 NOTE — ED Triage Notes (Signed)
 [redacted] weeks pregnant. BIB friend from home for sob. Reports sob onset 2-3d ago, worse today. DOE walking up stairs. Mentions arm numbness. Pt alert, NAD, calm, interactive, resps e/u, speaking in clear complete sentences. Steady gait.

## 2024-02-28 ENCOUNTER — Emergency Department (HOSPITAL_COMMUNITY)

## 2024-02-28 LAB — D-DIMER, QUANTITATIVE: D-Dimer, Quant: 1.16 ug{FEU}/mL — ABNORMAL HIGH (ref 0.00–0.50)

## 2024-02-28 MED ORDER — CEPHALEXIN 500 MG PO CAPS: 500.0000 mg | ORAL_CAPSULE | Freq: Two times a day (BID) | ORAL | 0 refills | Status: AC

## 2024-02-28 MED ORDER — IOHEXOL 350 MG/ML SOLN
75.0000 mL | Freq: Once | INTRAVENOUS | Status: AC | PRN
  Administered 2024-02-28: 75 mL via INTRAVENOUS

## 2024-02-28 NOTE — ED Provider Notes (Signed)
 " [facility] Emergency Department Provider Note MRN:  [id number]  Arrival date & time: 02/28/24     Chief Complaint   Shortness of Breath and [redacted] weeks pregnant   History of Present Illness   Glenda Robles is a 34 y.o. year-old female presents to the ED with chief complaint of shortness of breath, [redacted] weeks pregnant G2P1 with routine prenatal care with [facility]. States progressive onset of SHOB over the past few weeks. Today climbed a light of stairs that she normally climbs without difficulty and felt notably Glenda Robles for the next 10 minutes. No SHOB since that single event. No lower ext swelling, no significant weight changes, no recent extended travel. Also notes intermittent numbness in her left forearm/hand that started at night but now occurs during the day with certain positions resting her arm. This evening thought she may have had some blurry vision but unsure. No abdominal pain/bleeding/leaking fluids. No complications with first pregnancy.   She denies any chest pain.  She states that she doesn't feel short of breath at rest.  No hx of PE.  History provided by patient.   Review of Systems  Pertinent positive and negative review of systems noted in HPI.    Physical Exam   Vitals:   02/28/24 0056 02/28/24 0610  BP: 120/73 117/79  Pulse: 90 88  Resp: 12 14  Temp: 98.2 F (36.8 C) 97.7 F (36.5 C)  SpO2: 100% 100%    CONSTITUTIONAL:  non toxic-appearing, NAD NEURO:  Alert and oriented x 3, CN 3-12 grossly intact EYES:  eyes equal and reactive ENT/NECK:  Supple, no stridor  CARDIO:  normal rate, regular rhythm, appears well-perfused  PULM:  No respiratory distress, CTAB GI/GU:  non-distended, no focal tenderness MSK/SPINE:  No gross deformities, no edema, moves all extremities  SKIN:  no rash, atraumatic   *Additional and/or pertinent findings included in MDM below  Diagnostic and Interventional Summary    EKG  Interpretation Date/Time:  Thursday February 27 2024 18:46:32 EST Ventricular Rate:  100 PR Interval:  148 QRS Duration:  82 QT Interval:  334 QTC Calculation: 430 R Axis:   71  Text Interpretation: Normal sinus rhythm Normal ECG No previous ECGs available Confirmed by Theadore Sharper [phone]) on 02/28/2024 3:35:28 AM       Labs Reviewed  URINALYSIS, ROUTINE W REFLEX MICROSCOPIC - Abnormal; Notable for the following components:      Result Value   APPearance CLOUDY (*)    Leukocytes,Ua MODERATE (*)    Bacteria, UA MANY (*)    All other components within normal limits  COMPREHENSIVE METABOLIC PANEL WITH GFR - Abnormal; Notable for the following components:   Sodium 134 (*)    CO2 21 (*)    All other components within normal limits  D-DIMER, QUANTITATIVE - Abnormal; Notable for the following components:   D-Dimer, Quant 1.16 (*)    All other components within normal limits  URINE CULTURE  CBC WITH DIFFERENTIAL/PLATELET  PRO BRAIN NATRIURETIC PEPTIDE  TROPONIN T, HIGH SENSITIVITY  TROPONIN T, HIGH SENSITIVITY    DG Chest 2 View  Final Result    CT Angio Chest PE W and/or Wo Contrast    (Results Pending)    Medications - No data to display   Procedures  /  Critical Care Procedures  ED Course and Medical Decision Making  I have reviewed the triage vital signs, the nursing notes, and pertinent available records from the EMR.  Social Determinants Affecting  Complexity of Care: Patient has no clinically significant social determinants affecting this chief complaint..   ED Course: Clinical Course as of 02/28/24 0633  [facility] Feb 28, 2024  0510 D-dimer resulted at 1.16.  Due to elevation of d-dimer despite adjustment with YEARS criteria, will proceed with CT imaging.  We discussed risk and benefits including radiation risk, but also that health of the mother is most important to health of the child.  Patient gave verbal consent to proceed with CTA.  Discussed also with Dr. Theadore,  who agrees with CT. [RB]  0619 Urinalysis, Routine w reflex microscopic -Urine, Clean Catch(!) Asymptomatic bacteruria, will send urine for culture.  Will treat with keflex . [RB]    Clinical Course User Index [RB] Glenda Charleston, PA-C    Medical Decision Making Patient here with acute shortness of breath.  Worsened when going up stairs this morning.  She does not normally have any trouble with stairs.  She is noted to be mildly tachycardic in triage, but not tachycardic now.  Normal O2 saturation.  She does not have any leg swelling or history of PE or DVT.  No other readily identifiable causes of shortness of breath.  No cough or fever.  No anemia.  Chest x-ray is negative.  Troponin is negative.  BNP is negative.  PE remains on the ddx.  I discussed my workup plan with Dr. Theadore, who also saw the patient and recommends adding d-dimer and adjusting using YEARS criteria.    Amount and/or Complexity of Data Reviewed Labs: ordered. Decision-making details documented in ED Course. Radiology: ordered.  Risk Prescription drug management.         Consultants:    Treatment and Plan: Patient signed out to oncoming team, who will continue care.  Plan: Follow-up on CT PE study.  If negative, DC home with PCP/OBGYN follow-up.  Patient seen by and discussed with attending physician, Dr. Theadore, who recommends d-dimer to assess PE risk.  Final Clinical Impressions(s) / ED Diagnoses     ICD-10-CM   1. SOB (shortness of breath)  R06.02     2. Abnormal urinalysis  R82.90       ED Discharge Orders          Ordered    cephALEXin  (KEFLEX ) 500 MG capsule  2 times daily        02/28/24 0619              Discharge Instructions Discussed with and Provided to Patient:   Discharge Instructions   None      Glenda Charleston, PA-C 02/28/24 9366    [name], MD 02/28/24 [phone]  "

## 2024-02-28 NOTE — ED Provider Notes (Cosign Needed)
 Signout received from [place], PA-C.  In short patient is a 34 year old female [redacted] weeks gestation, G2, P1 who presents with progressive shortness of breath over the past few weeks.  At time of signout CT PE study is pending.  UA is with asymptomatic bacteriuria.  Cultures pending.  Keflex  sent in.   Physical Exam  BP 117/79 (BP Location: Right Arm)   Pulse 88   Temp 97.7 F (36.5 C) (Oral)   Resp 14   Ht 5' 2 (1.575 m)   Wt 77.1 kg   SpO2 100%   BMI 31.09 kg/m   Physical Exam Vitals and nursing note reviewed.  Constitutional:      General: She is not in acute distress.    Appearance: She is well-developed.  HENT:     Head: Normocephalic and atraumatic.  Eyes:     Conjunctiva/sclera: Conjunctivae normal.  Cardiovascular:     Rate and Rhythm: Normal rate and regular rhythm.     Heart sounds: No murmur heard. Pulmonary:     Effort: Pulmonary effort is normal. No respiratory distress.     Breath sounds: Normal breath sounds.  Abdominal:     Palpations: Abdomen is soft.     Tenderness: There is no abdominal tenderness.  Musculoskeletal:        General: No swelling.     Cervical back: Neck supple.  Skin:    General: Skin is warm and dry.     Capillary Refill: Capillary refill takes less than 2 seconds.  Neurological:     Mental Status: She is alert.  Psychiatric:        Mood and Affect: Mood normal.     Procedures  Procedures  ED Course / MDM   Clinical Course as of 02/28/24 0808  [facility] Feb 28, 2024  0510 D-dimer resulted at 1.16.  Due to elevation of d-dimer despite adjustment with YEARS criteria, will proceed with CT imaging.  We discussed risk and benefits including radiation risk, but also that health of the mother is most important to health of the child.  Patient gave verbal consent to proceed with CTA.  Discussed also with Dr. Theadore, who agrees with CT. [RB]  0619 Urinalysis, Routine w reflex microscopic -Urine, Clean Catch(!) Asymptomatic bacteruria, will  send urine for culture.  Will treat with keflex . [RB]  9285 CT Angio Chest PE W and/or Wo Contrast No PE [JT]    Clinical Course User Index [JT] Donnajean Lynwood DEL, PA-C [RB] [name], PA-C   Medical Decision Making Amount and/or Complexity of Data Reviewed Labs: ordered. Decision-making details documented in ED Course. Radiology: ordered.  Risk Prescription drug management.   PE scan negative.  Workup overall reassuring.  Patient be discharged home.  Discussed asymptomatic bacteria with the patient.  Keflex  has been sent into her pharmacy.  Culture pending.  Will follow-up with her OB.  Patient is understanding agreeable to plan.  Patient will be discharged home. The patient has been appropriately medically screened and/or stabilized in the ED. I have low suspicion for any other emergent medical condition which would require further screening, evaluation or treatment in the ED or require inpatient management. At time of discharge the patient is hemodynamically stable and in no acute distress. I have discussed work-up results and diagnosis with patient and answered all questions. Patient is agreeable with discharge plan. We discussed strict return precautions for returning to the emergency department and they verbalized understanding.          Donnajean,  Lynwood DEL, PA-C 02/28/24 [phone]

## 2024-02-28 NOTE — ED Notes (Signed)
 Resting in bed SpO2 100%, ambulatory around department without changes in value or wellbeing. Patient denied sensation of shortness of breath or any discomfort. Patient talking during ambulation without noted distress.

## 2024-02-28 NOTE — ED Notes (Signed)
 Patient transported to CT

## 2024-02-28 NOTE — Discharge Instructions (Addendum)
 You were evaluated in the emergency room for shortness of breath.  Your urine was notable for some bacteria.  Prescription for antibiotic was sent to your pharmacy.  Please be sure to complete the full course of antibiotics unless you receive a call regarding the recheck (culture).  The remainder of your lab work and imaging did not show any significant abnormality.  Please follow-up with your OB for further evaluation.  If you experience any new or worsening symptoms please return to the emergency room.

## 2024-02-28 NOTE — ED Notes (Signed)
 Pt ambulated to restroom.

## 2024-02-28 NOTE — ED Notes (Signed)
 Patient transported to X-ray
